# Patient Record
Sex: Female | Born: 1960 | Race: Black or African American | Hispanic: No | Marital: Married | Smoking: Former smoker
Health system: Southern US, Community
[De-identification: ages and names within clinical notes are randomized; demographics above are authoritative.]

## PROBLEM LIST (undated history)

## (undated) DIAGNOSIS — I10 Essential (primary) hypertension: Secondary | ICD-10-CM

## (undated) DIAGNOSIS — R809 Proteinuria, unspecified: Secondary | ICD-10-CM

## (undated) DIAGNOSIS — E113293 Type 2 diabetes mellitus with mild nonproliferative diabetic retinopathy without macular edema, bilateral: Secondary | ICD-10-CM

## (undated) DIAGNOSIS — Z636 Dependent relative needing care at home: Secondary | ICD-10-CM

## (undated) DIAGNOSIS — I4891 Unspecified atrial fibrillation: Secondary | ICD-10-CM

## (undated) DIAGNOSIS — I2510 Atherosclerotic heart disease of native coronary artery without angina pectoris: Secondary | ICD-10-CM

## (undated) DIAGNOSIS — I219 Acute myocardial infarction, unspecified: Secondary | ICD-10-CM

## (undated) DIAGNOSIS — R112 Nausea with vomiting, unspecified: Secondary | ICD-10-CM

## (undated) DIAGNOSIS — E559 Vitamin D deficiency, unspecified: Secondary | ICD-10-CM

## (undated) DIAGNOSIS — K219 Gastro-esophageal reflux disease without esophagitis: Secondary | ICD-10-CM

## (undated) DIAGNOSIS — E7800 Pure hypercholesterolemia, unspecified: Secondary | ICD-10-CM

## (undated) DIAGNOSIS — Z7984 Long term (current) use of oral hypoglycemic drugs: Secondary | ICD-10-CM

## (undated) DIAGNOSIS — I209 Angina pectoris, unspecified: Secondary | ICD-10-CM

## (undated) DIAGNOSIS — E119 Type 2 diabetes mellitus without complications: Secondary | ICD-10-CM

## (undated) DIAGNOSIS — M549 Dorsalgia, unspecified: Secondary | ICD-10-CM

## (undated) DIAGNOSIS — G709 Myoneural disorder, unspecified: Secondary | ICD-10-CM

## (undated) DIAGNOSIS — M1990 Unspecified osteoarthritis, unspecified site: Secondary | ICD-10-CM

## (undated) DIAGNOSIS — I509 Heart failure, unspecified: Secondary | ICD-10-CM

## (undated) DIAGNOSIS — R413 Other amnesia: Secondary | ICD-10-CM

## (undated) DIAGNOSIS — G4700 Insomnia, unspecified: Secondary | ICD-10-CM

## (undated) DIAGNOSIS — L659 Nonscarring hair loss, unspecified: Secondary | ICD-10-CM

## (undated) DIAGNOSIS — Z86718 Personal history of other venous thrombosis and embolism: Secondary | ICD-10-CM

## (undated) DIAGNOSIS — Z87891 Personal history of nicotine dependence: Secondary | ICD-10-CM

## (undated) DIAGNOSIS — E1122 Type 2 diabetes mellitus with diabetic chronic kidney disease: Secondary | ICD-10-CM

## (undated) DIAGNOSIS — D689 Coagulation defect, unspecified: Secondary | ICD-10-CM

## (undated) DIAGNOSIS — F32A Depression, unspecified: Secondary | ICD-10-CM

## (undated) DIAGNOSIS — G4733 Obstructive sleep apnea (adult) (pediatric): Secondary | ICD-10-CM

## (undated) DIAGNOSIS — N189 Chronic kidney disease, unspecified: Secondary | ICD-10-CM

## (undated) DIAGNOSIS — R1033 Periumbilical pain: Secondary | ICD-10-CM

## (undated) DIAGNOSIS — R600 Localized edema: Secondary | ICD-10-CM

## (undated) DIAGNOSIS — R001 Bradycardia, unspecified: Secondary | ICD-10-CM

## (undated) DIAGNOSIS — F329 Major depressive disorder, single episode, unspecified: Secondary | ICD-10-CM

## (undated) DIAGNOSIS — I700 Atherosclerosis of aorta: Secondary | ICD-10-CM

## (undated) DIAGNOSIS — F419 Anxiety disorder, unspecified: Secondary | ICD-10-CM

## (undated) DIAGNOSIS — R0602 Shortness of breath: Secondary | ICD-10-CM

## (undated) HISTORY — DX: Periumbilical pain: R10.33

## (undated) HISTORY — DX: Bradycardia, unspecified: R00.1

## (undated) HISTORY — DX: Type 2 diabetes mellitus with diabetic chronic kidney disease: E11.22

## (undated) HISTORY — DX: Nausea with vomiting, unspecified: R11.2

## (undated) HISTORY — DX: Coagulation defect, unspecified: D68.9

## (undated) HISTORY — DX: Atherosclerosis of aorta: I70.0

## (undated) HISTORY — DX: Unspecified atrial fibrillation: I48.91

## (undated) HISTORY — PX: CORONARY ARTERY BYPASS GRAFT: SHX141

## (undated) HISTORY — DX: Other amnesia: R41.3

## (undated) HISTORY — DX: Vitamin D deficiency, unspecified: E55.9

## (undated) HISTORY — PX: CYST EXCISION: SHX5701

## (undated) HISTORY — DX: Chronic kidney disease, unspecified: N18.9

## (undated) HISTORY — DX: Personal history of other venous thrombosis and embolism: Z86.718

## (undated) HISTORY — DX: Dorsalgia, unspecified: M54.9

## (undated) HISTORY — DX: Type 2 diabetes mellitus with other diabetic kidney complication: R80.9

## (undated) HISTORY — DX: Morbid (severe) obesity due to excess calories: E66.01

## (undated) HISTORY — DX: Long term (current) use of oral hypoglycemic drugs: Z79.84

## (undated) HISTORY — DX: Localized edema: R60.0

## (undated) HISTORY — DX: Nonscarring hair loss, unspecified: L65.9

## (undated) HISTORY — DX: Dependent relative needing care at home: Z63.6

## (undated) HISTORY — PX: CHOLECYSTECTOMY: SHX55

## (undated) HISTORY — PX: CORONARY STENT PLACEMENT: SHX1402

## (undated) HISTORY — DX: Type 2 diabetes mellitus with mild nonproliferative diabetic retinopathy without macular edema, bilateral: E11.3293

## (undated) HISTORY — DX: Insomnia, unspecified: G47.00

## (undated) HISTORY — DX: Obstructive sleep apnea (adult) (pediatric): G47.33

## (undated) HISTORY — DX: Personal history of nicotine dependence: Z87.891

## (undated) HISTORY — DX: Atherosclerotic heart disease of native coronary artery without angina pectoris: I25.10

---

## 1998-01-11 ENCOUNTER — Ambulatory Visit (HOSPITAL_COMMUNITY): Admission: RE | Admit: 1998-01-11 | Discharge: 1998-01-11 | Payer: Self-pay | Admitting: Cardiology

## 1998-04-03 ENCOUNTER — Emergency Department (HOSPITAL_COMMUNITY): Admission: EM | Admit: 1998-04-03 | Discharge: 1998-04-03 | Payer: Self-pay | Admitting: Emergency Medicine

## 1998-04-04 ENCOUNTER — Ambulatory Visit (HOSPITAL_BASED_OUTPATIENT_CLINIC_OR_DEPARTMENT_OTHER): Admission: RE | Admit: 1998-04-04 | Discharge: 1998-04-04 | Payer: Self-pay | Admitting: Orthopaedic Surgery

## 1998-10-04 ENCOUNTER — Ambulatory Visit (HOSPITAL_COMMUNITY): Admission: RE | Admit: 1998-10-04 | Discharge: 1998-10-04 | Payer: Self-pay | Admitting: Cardiology

## 1998-10-04 ENCOUNTER — Encounter: Payer: Self-pay | Admitting: Cardiology

## 1999-01-17 ENCOUNTER — Encounter: Payer: Self-pay | Admitting: Endocrinology

## 1999-01-17 ENCOUNTER — Emergency Department (HOSPITAL_COMMUNITY): Admission: EM | Admit: 1999-01-17 | Discharge: 1999-01-17 | Payer: Self-pay | Admitting: Endocrinology

## 1999-02-28 ENCOUNTER — Encounter: Payer: Self-pay | Admitting: Orthopaedic Surgery

## 1999-02-28 ENCOUNTER — Ambulatory Visit (HOSPITAL_COMMUNITY): Admission: RE | Admit: 1999-02-28 | Discharge: 1999-02-28 | Payer: Self-pay | Admitting: Cardiology

## 1999-04-21 ENCOUNTER — Emergency Department (HOSPITAL_COMMUNITY): Admission: EM | Admit: 1999-04-21 | Discharge: 1999-04-21 | Payer: Self-pay | Admitting: Emergency Medicine

## 1999-04-21 ENCOUNTER — Encounter: Payer: Self-pay | Admitting: Emergency Medicine

## 1999-07-11 ENCOUNTER — Ambulatory Visit (HOSPITAL_COMMUNITY): Admission: RE | Admit: 1999-07-11 | Discharge: 1999-07-11 | Payer: Self-pay | Admitting: Cardiology

## 1999-07-11 ENCOUNTER — Encounter: Payer: Self-pay | Admitting: Cardiology

## 2000-03-29 ENCOUNTER — Encounter (HOSPITAL_BASED_OUTPATIENT_CLINIC_OR_DEPARTMENT_OTHER): Payer: Self-pay | Admitting: General Surgery

## 2000-03-31 ENCOUNTER — Ambulatory Visit (HOSPITAL_COMMUNITY): Admission: RE | Admit: 2000-03-31 | Discharge: 2000-03-31 | Payer: Self-pay | Admitting: General Surgery

## 2000-03-31 ENCOUNTER — Encounter (INDEPENDENT_AMBULATORY_CARE_PROVIDER_SITE_OTHER): Payer: Self-pay | Admitting: *Deleted

## 2000-04-29 ENCOUNTER — Other Ambulatory Visit: Admission: RE | Admit: 2000-04-29 | Discharge: 2000-04-29 | Payer: Self-pay | Admitting: Obstetrics

## 2000-04-29 ENCOUNTER — Encounter (INDEPENDENT_AMBULATORY_CARE_PROVIDER_SITE_OTHER): Payer: Self-pay

## 2000-05-06 ENCOUNTER — Encounter: Payer: Self-pay | Admitting: Obstetrics

## 2000-05-06 ENCOUNTER — Ambulatory Visit (HOSPITAL_COMMUNITY): Admission: RE | Admit: 2000-05-06 | Discharge: 2000-05-06 | Payer: Self-pay | Admitting: Obstetrics

## 2001-01-05 ENCOUNTER — Emergency Department (HOSPITAL_COMMUNITY): Admission: EM | Admit: 2001-01-05 | Discharge: 2001-01-05 | Payer: Self-pay

## 2001-08-15 ENCOUNTER — Encounter: Payer: Self-pay | Admitting: Cardiology

## 2001-08-15 ENCOUNTER — Ambulatory Visit (HOSPITAL_COMMUNITY): Admission: RE | Admit: 2001-08-15 | Discharge: 2001-08-15 | Payer: Self-pay | Admitting: Cardiology

## 2001-08-25 ENCOUNTER — Ambulatory Visit (HOSPITAL_COMMUNITY): Admission: RE | Admit: 2001-08-25 | Discharge: 2001-08-25 | Payer: Self-pay | Admitting: Obstetrics

## 2001-08-25 ENCOUNTER — Encounter: Payer: Self-pay | Admitting: Obstetrics

## 2002-09-08 ENCOUNTER — Ambulatory Visit (HOSPITAL_COMMUNITY): Admission: RE | Admit: 2002-09-08 | Discharge: 2002-09-08 | Payer: Self-pay | Admitting: Obstetrics

## 2002-09-08 ENCOUNTER — Encounter: Payer: Self-pay | Admitting: Obstetrics

## 2002-10-21 ENCOUNTER — Inpatient Hospital Stay (HOSPITAL_COMMUNITY): Admission: EM | Admit: 2002-10-21 | Discharge: 2002-10-25 | Payer: Self-pay | Admitting: Emergency Medicine

## 2002-10-21 ENCOUNTER — Encounter: Payer: Self-pay | Admitting: Emergency Medicine

## 2002-10-23 ENCOUNTER — Encounter: Payer: Self-pay | Admitting: Cardiology

## 2002-12-12 ENCOUNTER — Ambulatory Visit (HOSPITAL_COMMUNITY): Admission: RE | Admit: 2002-12-12 | Discharge: 2002-12-14 | Payer: Self-pay | Admitting: Cardiology

## 2003-01-01 ENCOUNTER — Encounter (HOSPITAL_COMMUNITY): Admission: RE | Admit: 2003-01-01 | Discharge: 2003-04-01 | Payer: Self-pay | Admitting: Cardiology

## 2003-03-16 ENCOUNTER — Encounter: Payer: Self-pay | Admitting: Cardiology

## 2003-03-16 ENCOUNTER — Ambulatory Visit (HOSPITAL_COMMUNITY): Admission: RE | Admit: 2003-03-16 | Discharge: 2003-03-16 | Payer: Self-pay | Admitting: Cardiology

## 2003-08-24 ENCOUNTER — Ambulatory Visit (HOSPITAL_COMMUNITY): Admission: RE | Admit: 2003-08-24 | Discharge: 2003-08-24 | Payer: Self-pay | Admitting: Obstetrics

## 2003-10-10 ENCOUNTER — Encounter (INDEPENDENT_AMBULATORY_CARE_PROVIDER_SITE_OTHER): Payer: Self-pay | Admitting: Specialist

## 2003-10-10 ENCOUNTER — Ambulatory Visit (HOSPITAL_COMMUNITY): Admission: RE | Admit: 2003-10-10 | Discharge: 2003-10-10 | Payer: Self-pay | Admitting: Obstetrics

## 2004-02-18 ENCOUNTER — Ambulatory Visit (HOSPITAL_COMMUNITY): Admission: RE | Admit: 2004-02-18 | Discharge: 2004-02-18 | Payer: Self-pay | Admitting: Cardiology

## 2004-05-08 ENCOUNTER — Ambulatory Visit (HOSPITAL_COMMUNITY): Admission: RE | Admit: 2004-05-08 | Discharge: 2004-05-08 | Payer: Self-pay | Admitting: Cardiology

## 2004-07-08 ENCOUNTER — Encounter: Admission: RE | Admit: 2004-07-08 | Discharge: 2004-08-07 | Payer: Self-pay | Admitting: Orthopedic Surgery

## 2004-09-18 ENCOUNTER — Ambulatory Visit (HOSPITAL_COMMUNITY): Admission: RE | Admit: 2004-09-18 | Discharge: 2004-09-18 | Payer: Self-pay | Admitting: Obstetrics

## 2004-10-17 ENCOUNTER — Inpatient Hospital Stay (HOSPITAL_COMMUNITY): Admission: EM | Admit: 2004-10-17 | Discharge: 2004-10-17 | Payer: Self-pay | Admitting: Emergency Medicine

## 2005-05-10 ENCOUNTER — Emergency Department (HOSPITAL_COMMUNITY): Admission: EM | Admit: 2005-05-10 | Discharge: 2005-05-10 | Payer: Self-pay | Admitting: Family Medicine

## 2005-07-13 ENCOUNTER — Emergency Department (HOSPITAL_COMMUNITY): Admission: EM | Admit: 2005-07-13 | Discharge: 2005-07-13 | Payer: Self-pay | Admitting: Family Medicine

## 2005-07-17 ENCOUNTER — Inpatient Hospital Stay (HOSPITAL_COMMUNITY): Admission: AD | Admit: 2005-07-17 | Discharge: 2005-07-17 | Payer: Self-pay | Admitting: Obstetrics

## 2005-09-25 ENCOUNTER — Ambulatory Visit (HOSPITAL_COMMUNITY): Admission: RE | Admit: 2005-09-25 | Discharge: 2005-09-25 | Payer: Self-pay | Admitting: Obstetrics

## 2005-10-05 ENCOUNTER — Ambulatory Visit (HOSPITAL_COMMUNITY): Admission: RE | Admit: 2005-10-05 | Discharge: 2005-10-05 | Payer: Self-pay | Admitting: Cardiology

## 2006-11-12 ENCOUNTER — Ambulatory Visit (HOSPITAL_COMMUNITY): Admission: RE | Admit: 2006-11-12 | Discharge: 2006-11-12 | Payer: Self-pay | Admitting: Obstetrics

## 2006-12-14 ENCOUNTER — Emergency Department (HOSPITAL_COMMUNITY): Admission: EM | Admit: 2006-12-14 | Discharge: 2006-12-14 | Payer: Self-pay | Admitting: Family Medicine

## 2007-06-01 ENCOUNTER — Emergency Department (HOSPITAL_COMMUNITY): Admission: EM | Admit: 2007-06-01 | Discharge: 2007-06-01 | Payer: Self-pay | Admitting: Emergency Medicine

## 2007-09-21 ENCOUNTER — Encounter (HOSPITAL_COMMUNITY): Admission: RE | Admit: 2007-09-21 | Discharge: 2007-09-22 | Payer: Self-pay | Admitting: Cardiology

## 2007-11-21 ENCOUNTER — Ambulatory Visit (HOSPITAL_COMMUNITY): Admission: RE | Admit: 2007-11-21 | Discharge: 2007-11-21 | Payer: Self-pay | Admitting: Obstetrics

## 2008-04-08 ENCOUNTER — Emergency Department (HOSPITAL_COMMUNITY): Admission: EM | Admit: 2008-04-08 | Discharge: 2008-04-08 | Payer: Self-pay | Admitting: Emergency Medicine

## 2008-09-05 ENCOUNTER — Emergency Department (HOSPITAL_COMMUNITY): Admission: EM | Admit: 2008-09-05 | Discharge: 2008-09-05 | Payer: Self-pay | Admitting: Family Medicine

## 2009-02-19 ENCOUNTER — Ambulatory Visit (HOSPITAL_COMMUNITY): Admission: RE | Admit: 2009-02-19 | Discharge: 2009-02-19 | Payer: Self-pay | Admitting: Cardiology

## 2009-02-19 ENCOUNTER — Ambulatory Visit: Payer: Self-pay | Admitting: Vascular Surgery

## 2009-02-19 ENCOUNTER — Encounter (INDEPENDENT_AMBULATORY_CARE_PROVIDER_SITE_OTHER): Payer: Self-pay | Admitting: Cardiology

## 2009-03-28 ENCOUNTER — Emergency Department (HOSPITAL_COMMUNITY): Admission: EM | Admit: 2009-03-28 | Discharge: 2009-03-28 | Payer: Self-pay | Admitting: Emergency Medicine

## 2009-04-23 ENCOUNTER — Encounter: Admission: RE | Admit: 2009-04-23 | Discharge: 2009-04-23 | Payer: Self-pay | Admitting: Obstetrics

## 2009-11-27 ENCOUNTER — Encounter (HOSPITAL_COMMUNITY): Admission: RE | Admit: 2009-11-27 | Discharge: 2010-01-21 | Payer: Self-pay | Admitting: Cardiology

## 2010-04-26 ENCOUNTER — Emergency Department (HOSPITAL_COMMUNITY): Admission: EM | Admit: 2010-04-26 | Discharge: 2010-04-26 | Payer: Self-pay | Admitting: Emergency Medicine

## 2010-06-11 ENCOUNTER — Ambulatory Visit (HOSPITAL_COMMUNITY): Admission: RE | Admit: 2010-06-11 | Payer: Self-pay | Source: Home / Self Care | Admitting: Obstetrics

## 2010-07-13 ENCOUNTER — Encounter: Payer: Self-pay | Admitting: Obstetrics

## 2010-07-13 ENCOUNTER — Encounter: Payer: Self-pay | Admitting: Cardiology

## 2010-07-14 ENCOUNTER — Encounter: Payer: Self-pay | Admitting: Obstetrics

## 2010-09-03 LAB — COMPREHENSIVE METABOLIC PANEL
AST: 32 U/L (ref 0–37)
Albumin: 3.7 g/dL (ref 3.5–5.2)
Alkaline Phosphatase: 80 U/L (ref 39–117)
BUN: 7 mg/dL (ref 6–23)
CO2: 27 mEq/L (ref 19–32)
Calcium: 9 mg/dL (ref 8.4–10.5)
Chloride: 103 mEq/L (ref 96–112)
Creatinine, Ser: 0.6 mg/dL (ref 0.4–1.2)
GFR calc Af Amer: >60 mL/min (ref >60)
Glucose, Bld: 145 mg/dL — ABNORMAL HIGH (ref 70–99)
Sodium: 137 mEq/L (ref 135–145)
Total Protein: 7 g/dL (ref 6.0–8.3)

## 2010-09-03 LAB — DIFFERENTIAL
Basophils Absolute: 0 10*3/uL (ref 0.0–0.1)
Basophils Relative: 0 % (ref 0–1)
Lymphocytes Relative: 32 % (ref 12–46)
Monocytes Absolute: 0.4 10*3/uL (ref 0.1–1.0)
Neutro Abs: 3.5 10*3/uL (ref 1.7–7.7)
Neutrophils Relative %: 59 % (ref 43–77)

## 2010-09-03 LAB — CBC
HCT: 38 % (ref 36.0–46.0)
Hemoglobin: 12.3 g/dL (ref 12.0–15.0)
MCHC: 32.4 g/dL (ref 30.0–36.0)
MCV: 84.6 fL (ref 78.0–100.0)
RBC: 4.49 MIL/uL (ref 3.87–5.11)

## 2010-11-07 NOTE — Discharge Summary (Signed)
NAME:  Diane Steele, Diane Steele                          ACCOUNT NO.:  [id number]   MEDICAL RECORD NO.:  [id number]                   PATIENT TYPE:  OIB   LOCATION:  6527                                 FACILITY:  [facility]   PHYSICIAN:  Mohan N. Terrence Dupont, M.D.              DATE OF BIRTH:  [birth year]   DATE OF ADMISSION:  12/12/2002  DATE OF DISCHARGE:  12/14/2002                                 DISCHARGE SUMMARY   ADMISSION DIAGNOSES:  1. Stable angina, status post recent small non-Q-wave myocardial infarction,     status post recent percutaneous transluminal coronary angioplasty and     stenting to saphenous vein graft to diagonal 1.  2. Severe three-vessel coronary artery disease, status post coronary artery     bypass graft.  3. Hypertension.  4. Noninsulin-dependent diabetes mellitus.  5. Hypercholesterolemia.  6. Morbid obesity.  7. History of tobacco abuse.  8. History of gastritis.  9. Chronic anemia.  10.      History of menorrhagia in the past.   FINAL DIAGNOSES:  1. Stable angina, status post percutaneous transluminal coronary angioplasty     and stenting to left circumflex, status post recent small non-Q-wave     myocardial infarction, status post percutaneous transluminal coronary     angioplasty and stent to saphenous vein graft to diagonal 1 in May of     2004.  2. Three-vessel coronary artery disease, status post coronary artery bypass     graft.  3. Noninsulin-dependent diabetes mellitus.  4. Hypercholesterolemia.  5. Morbid obesity.  6. History of tobacco abuse.  7. Chronic anemia.  8. History of gastritis in the past.  9. History of menorrhagia in the past.   DISCHARGE HOME MEDICATIONS:  1. Toprol XL 100 mg one tablet.  2. Altace 10 mg one capsule daily.  3. Imdur 60 mg one tablet daily in the morning.  4. Baby aspirin 81 mg two tablets daily.  5. Plavix 75 mg one tablet daily with food.  6. Lipitor 20 mg one tablet daily.  7. Prevacid 30 mg one capsule daily.  8. Avandamet 1/500 one tablet twice daily.  9. Glucotrol 10 mg one tablet twice daily.  10.      Nitroglycerin sublingual 0.4 mg as needed.  11.      Feosol one tablet twice daily.   ACTIVITY:  Avoid heavy lifting, pushing or pulling for forty-eight hours.   DIET:  Low salt/low cholesterol/1800 calories/weight-reducing diet.   DISCHARGE INSTRUCTIONS:  Post angioplasty and stent instructions have been  given.  The patient has been advised to monitor blood sugar twice daily as  before.  Patient has been scheduled for outpatient nutrition and diabetic  management classes.  The patient will be scheduled for cardiac  rehabilitation as an outpatient.  She will follow up with me in one week.   CONDITION AT DISCHARGE:  Is stable.   BRIEF HISTORY OF  PRESENT ILLNESS:  [name] is a 50 year old black  female with past medical history significant for severe three-vessel  coronary artery disease, status post coronary artery bypass graft in  February of 1997, hypertension, noninsulin-dependent diabetes mellitus,  status post recent non-Q-wave myocardial infarction requiring percutaneous  transluminal coronary angioplasty and stenting to saphenous vein graft to  diagonal 1 in May of 2004 and was noted to have severe distal left  circumflex stenosis and moderate proximal left circumflex stenosis which was  not bypassed.  The patient also has hypercholesterolemia, history of tobacco  abuse, history of gastritis, chronic anemia and was admitted for elective  percutaneous intervention to monitor the left circumflex.  The patient was  recently discharged from the hospital following a small non-Q-wave  myocardial infarction requiring percutaneous transluminal coronary  angioplasty and stenting to the saphenous vein graft to diagonal 1.  The  patient complained of retrosternal chest heaviness off and on lasting a few  minutes and relieved with rest and sublingual nitroglycerin.  The patient   denies any nausea, vomiting or diaphoresis, denies palpitations,  lightheadedness or syncope and denies paroxysmal nocturnal dyspnea,  orthopnea or leg swelling.   PAST MEDICAL HISTORY:  As above.   PAST SURGICAL HISTORY:  She had coronary artery bypass grafting in February  of 1997.  She had cholecystectomy in the past.  She had right breast cyst  resection in 1989.  She had tubal ligation in 1987.  She had benign  dissection of a vaginal tumor in 1998.   SOCIAL HISTORY:  She is married.  She has three children.  She smokes one  pack per day times nine years, quit after coronary artery bypass grafting.  No history of alcohol abuse, though she drinks beer occasionally socially.  No history of drug abuse.   FAMILY HISTORY:  Her father is alive.  He is hypertensive and diabetic.  He  has congestive heart failure.  Her mother is diabetic.  She has angina and  hypertension.   MEDICATIONS AT HOME:  1. Toprol XL 100 mg p.o. daily.  2. Altace 5 mg p.o. daily.  3. Imdur 60 mg p.o. daily.  4. Baby aspirin 81 mg p.o. daily.  5. Plavix 75 mg p.o. daily.  6. Lipitor 40 mg p.o. daily.  7. Prevacid 30 mg p.o. daily.  8. Nitroglycerin sublingual p.r.n.  9. Glucotrol 10 mg twice daily.  10.      Avandamet 1/500 mg p.o. twice daily.   PHYSICAL EXAMINATION:  General:  On examination, she alert, awoke and  oriented x3 in no acute distress.  Vital Signs:  Her blood pressure was  110/60, pulse was 76 and regular.  Eyes:  Conjunctivae were pink.  Neck:  Supple.  No jugular venous distention.  No bruit.  Lungs:  Were clear to  auscultation without rhonchi or rales.  Cardiovascular Examination:  S1 and  S2 were normal.  There was no S3 gallop.  Abdomen:  Was soft.  Bowel sounds  were present.  Abdomen was nontender.  Extremities:  There was no clubbing,  cyanosis or edema.   LABORATORY DATA:  Her hemoglobin was 9.5, hematocrit was 28.8 and white count was 6.0.  CK post procedure was 69 with an MB  of 2.4.  Her hemoglobin  today is stable at 9.7, hematocrit of 29.5 and white count of 5.3.  Her  cholesterol is 93, low-density lipoprotein 49, high-density lipoprotein 35.  Potassium is 3.9, blood urea nitrogen is 7 and  creatinine .8.  Admission EKG  showed normal sinus rhythm with mild T wave inversion in lateral leads.  Repeat EKG post procedure showed similar changes.  Repeat EKG done today  also showed T wave inversion in lateral leads.   BRIEF HOSPITAL COURSE:  The patient was an a.m. admit and underwent  percutaneous transluminal coronary angioplasty and stenting to left  circumflex as per procedure report.  The patient tolerated the procedure  well.  There were no complications.  The patient had one episode of chest  pain lasting three minutes relieved with one sublingual nitroglycerin.  The  patient has been ambulating in the hallway without any problems.  Her CPK's  are negative post procedure.  Post procedure EKG showed similar T wave  inversions in lateral leads.  There were no new acute ischemic changes.  This morning, her blood pressure has been elevated.  The patient has been  restarted on her Imdur and angiotensin-converting enzyme inhibitor.  The  patient will be continued on beta blockers and the rest of her medications.  The patient has been advised to ambulate in the hallway today.  If she stays  asymptomatic, the patient will be discharged home this afternoon.                                               Allegra Lai. Terrence Dupont, M.D.    MNH/MEDQ  D:  12/14/2002  T:  12/15/2002  Job:  [id number]

## 2010-11-07 NOTE — Cardiovascular Report (Signed)
Diane, Steele                ACCOUNT NO.:  [id number]   MEDICAL RECORD NO.:  [id number]          PATIENT TYPE:  OIB   LOCATION:  2899                         FACILITY:  [facility]   PHYSICIAN:  Allegra Lai. Terrence Dupont, M.D. DATE OF BIRTH:  [birth year]   DATE OF PROCEDURE:  05/08/2004  DATE OF DISCHARGE:  05/08/2004                              CARDIAC CATHETERIZATION   PROCEDURE:  Left cardiac catheterization with selective left and right  coronary angiography, visualization of saphenous vein grafts, left internal  mammary graft, left ventriculography, via right groin using Judkins  technique.   INDICATIONS FOR PROCEDURE:  Diane Steele is a 50 year old black female with  past medical history significant for severe three vessel coronary artery  disease status post coronary artery bypass grafting x 4 in February 1997.  She had LIMA to LAD, saphenous vein graft to RCA, saphenous vein graft to  first diagonal, and saphenous vein graft to second obtuse marginal.  She had  non-Q wave MI in May 2004 and subsequently had PTCA and stenting to  saphenous vein graft to first diagonal and also had PTCA and stenting to  left circumflex in June 2004 which was not bypassed.  The patient also has a  history of hypertension, non-insulin dependent diabetes mellitus,  hypercholesterolemia, morbid obesity, history of tobacco abuse, history of  chronic anemia, history of gastritis, complaints of retrosternal chest pain  described as heaviness, grade 5/10, relieved with rest and sublingual  nitroglycerin.  Also complains of exertional dyspnea and chest pain which is  relieved with rest and sublingual nitroglycerin.  The patient denies any  palpitations, light headedness, or syncope.  Denies PND, orthopnea, or leg  swelling.  Denies nausea, vomiting, diaphoresis.  Denies rest or nocturnal  angina.  Denies relation of chest pain to food, breathing, or movement.   PAST MEDICAL HISTORY:  As above.   PAST SURGICAL  HISTORY:  She had coronary artery bypass grafting x 4 as above  in 1997.  She had cholecystectomy in the past.  She had right wrist cyst  resection in the past.  She had tubal ligation in 1987.  She had resection  of benign vaginal tumor in 1988.   SOCIAL HISTORY:  She is married and has three children.  She smoked one pack  per day for nine years, quit after CABG, no history of alcohol abuse, drinks  beer occasionally socially, no history of drug abuse.   FAMILY HISTORY:  Positive for diabetes and hypertension.   ALLERGIES:  No known drug allergies.   MEDICATIONS:  Baby aspirin 81 mg p.o. daily, Plavix 75 mg p.o. daily, Toprol  XL 100 mg p.o. daily, Altace 10 mg p.o. daily, Imdur 60 mg p.o. q.a.m.,  Prevacid 30 mg p.o. daily, Glucophage XR 500 mg p.o. b.i.d., Glucotrol XL 10  mg p.o. daily, Lipitor 20 mg p.o. daily.   PHYSICAL EXAMINATION:  She was alert, awake, oriented X 3 in no acute  distress.  Blood pressure in the office was 120/70, pulse 72 and regular.  Conjunctivae was pink.  Neck supple, no JVD, no bruit.  Lungs  were clear to  auscultation without rhonchi or rales.  Cardiovascular exam revealed S1 and  S2 normal, there was no S3 gallop.  Abdomen was soft, bowel sounds present,  nontender.  Extremities:  No cyanosis, clubbing, and edema.   IMPRESSION:  Accelerated angina, rule out progression of coronary artery  disease, CAD, history of non-Q wave MI status post CABG, status post PCI to  saphenous vein graft to first diagonal in May 2004 and PCI to left  circumflex in June 2004.  Hypertension, non insulin dependent diabetes  mellitus, hypercholesterolemia, history of tobacco abuse, morbid obesity,  chronic anemia, history of gastritis.   I discussed with the patient regarding various options of treatment, i.e.  noninvasive stress testing versus left cath with possible PTCA and stenting  with risks and benefits, i.e., death, MI, stroke, need for emergency CABG,  risks of  restenosis, local vascular complications, etc., and consent was  obtained for PCI.   PROCEDURE:  After obtaining informed consent, the patient was taken to the  cath lab and placed on fluoroscopy table.  The right groin was prepped and  draped in the usual fashion.  2% Xylocaine was used for local anesthesia in  the right groin.  With the help of thin wall needle, 6 French arterial  sheath was placed.  The sheath was aspirated and flushed.  Next, 6 French  left Judkins catheter was advanced over the wire under fluoroscopic guidance  to the ascending aorta.  The wire was pulled out, the catheter was  aspirated, and connected to the manifold.  The catheter was further advanced  and engaged into the left coronary ostium.  Multiple views of the left  system were taken.  Next, the catheter was disengaged and was pulled out  over the wire and was replaced with 6 French right Judkins catheter which  was advanced over the wire under fluoroscopic guidance up to the ascending  aorta.  The wire was pulled out, the catheter was aspirated and connected to  the manifold.  The catheter was further advanced and engaged into right  coronary ostium.  A single view of the right coronary artery was obtained.  Next, catheter was disengaged and was engaged into saphenous vein graft to  first diagonal.  Multiple views of this graft were taken.  Next, the  catheter was disengaged and was engaged into saphenous vein graft to OM2,  multiple views of this graft were taken.  Next, this catheter was disengaged  and was engaged into saphenous vein graft to RCA. Multiple views of this  graft were taken.  Next, the catheter was disengaged and was advanced under  fluoroscopic guidance up to the left subclavian artery.  This catheter was  exchanged over the wire with LIMA diagnostic catheter which was advanced  under fluoroscopic guidance and was engaged to LIMA to LAD.  Multiple views of this graft were taken.  Next, the  catheter was disengaged and was pulled  out over the wire and was replaced with a 6 French pigtail catheter which  was advanced over the wire under fluoroscopic guidance up to the ascending  aorta.  The wire was pulled out and the catheter was aspirated and connected  to the manifold.  The catheter was further advanced across the aortic valve  into the LV.  LV pressures were recorded.  Next, LV-graph was done in 30  degree RAO position, post angiographic pressures recorded from LV and then  pull back pressures were recorded from the  aorta.  There was no gradient  across the aortic valve.  Next, the pigtail catheter was pulled out over the  wire, the sheaths were aspirated and flushed.   FINDINGS:  LV showed inferior wall mid severe hypokinesia, EF of 50-55%.  The left main was patent.  The LAD was 100% occluded proximally as before.  The left circumflex has 15-20% ostial stenosis.  Stented segment in  proximal, mid, and distal portion was patent except there was 15-20% mid  focal stenosis with TIMI grade 3 distal flow.  OM1 was 100% occluded which  was a small vessel.  OM2 has 70-80% long proximal stenosis.  This vessel has  saphenous vein graft which is filling from saphenous vein graft to OM2.  RCA  is 100% occluded at the proximal portion as before.  Saphenous vein graft to  first diagonal has 30-40% proximal stenosis at the proximal edge of the  stent and mid portion of the stent with TIMI grade 3 distal flow.  Saphenous  vein graft to OM2 has 20-30% proximal stenosis and 50-60% anastomotic  stenosis.  This also has TIMI grade 3 distal flow.  Saphenous vein graft to  RCA has 30-40% proximal and mid stenosis, and 70-80% anastomotic stenosis.  This graft is degenerated with TIMI grade 2 distal flow and is not suitable  for an intervention, distally, the native vessel is very small.  The LIMA to  LAD is patent.   The patient tolerated the procedure well.  There were no complications.    PLAN:  Maximize anti-anginal medication.  The patient will be discharged  home this afternoon.  The patient was transferred to the recovery room in  stable condition.       MNH/MEDQ  D:  05/08/2004  T:  05/08/2004  Job:  [id number]

## 2010-11-07 NOTE — Op Note (Signed)
NAME:  Diane Steele, Diane Steele                          ACCOUNT NO.:  [id number]   MEDICAL RECORD NO.:  [id number]                   PATIENT TYPE:  AMB   LOCATION:  SDC                                  FACILITY:  [facility]   PHYSICIAN:  Frederico Hamman, M.D.           DATE OF BIRTH:  1960-08-19   DATE OF PROCEDURE:  10/10/2003  DATE OF DISCHARGE:                                 OPERATIVE REPORT   PREOPERATIVE DIAGNOSES:  1. Dysfunctional uterine bleeding.  2. Dysmenorrhea.   ANESTHESIA:  General.   SURGEON:  Frederico Hamman, M.D.   Under general anesthesia, patient  was in lithotomy position.  The perineum  and vagina were prepped and draped, the bladder emptied with a straight  catheter.  Bimanual exam revealed the uterus to be normal size, negative  adnexa.  A speculum was placed in the vagina and the cervix was grasped with  the tenaculum at 12 o'clock, and the cervix was curetted.  The endometrial  cavity was sounded to 9 cm.  The Hegar dilator was placed in the cervix and  the cervix length was 4 cm, making the cavity length 5 cm.  Endocervix was  curetted.  Cervix was dilated to a #27 Kennon Rounds.  The diagnostic hysteroscope  was then inserted, the pump was set at 70, and we used Ringer's lactate.  The cavity was noted to be normal.  The hysteroscope was removed.  The  Novasure device was inserted and the cavity integrity was tested and found  to be normal.  The Novasure ablation was performed at 88 watts for two  minutes.  The length of the cavity was 5 cm and the width was 4 cm.  After  the ablation was complete, the Novasure device was removed and the  hysteroscope reinserted.  The cavity was noted to be completely ablated.  The procedure was terminated and the patient tolerated the procedure well,  taken to the recovery room in good condition.                                               Frederico Hamman, M.D.    BAM/MEDQ  D:  10/10/2003  T:  10/11/2003  Job:  [id number]

## 2010-11-07 NOTE — Discharge Summary (Signed)
[name], [name]                ACCOUNT NO.:  [id number]   MEDICAL RECORD NO.:  [id number]          PATIENT TYPE:  INP   LOCATION:  3707                         FACILITY:  [facility]   PHYSICIAN:  Mohan N. Terrence Dupont, M.D. DATE OF BIRTH:  [birth year]   DATE OF ADMISSION:  10/16/2004  DATE OF DISCHARGE:  10/17/2004                                 DISCHARGE SUMMARY   ADMITTING DIAGNOSES:  1.  New onset angina, rule out coronary insufficiency.  2.  Coronary artery disease.  History of non-Q wave myocardial infarction,      status post coronary artery bypass graft.  3.  Hypertension.  4.  Non-insulin-dependent diabetes mellitus.  5.  Hypercholesterolemia.  6.  History of gastritis.  7.  Chronic anemia.   DISCHARGE DIAGNOSES:  1.  Stable angina.  Status post negative Persantine Myoview.  2.  Coronary artery disease.  History of non-Q wave myocardial infarction.      Status post percutaneous coronary intervention in the past, post      coronary artery bypass graft.  3.  Status post coronary artery bypass grafting in 1997.  4.  Hypertension.  5.  Non-insulin-dependent diabetes mellitus.  6.  Hypercholesterolemia.  7.  History of gastritis.  8.  Chronic anemia.   DISCHARGE MEDICATIONS:  1.  Enteric-coated aspirin 81 mg one tablet daily.  2.  Plavix 75 mg one tablet daily with food.  3.  Toprol-XL 100 mg one tablet daily.  4.  Altace 10 mg one capsule daily.  5.  Imdur 60 mg one tablet twice daily.  6.  Prevacid 30 mg one capsule daily, one-half hour before breakfast.  7.  Lipitor 20 mg one tablet daily.  8.  Glucophage XR 500 mg one tablet twice daily.  9.  Glucotrol XL 10 mg one tablet daily.  10. Nitrostat 0.4 mg sublingual, use as directed.  11. Feosol one tablet twice daily.   DIET:  Low salt, low cholesterol, 1800 calorie ADA diet.  The patient has  been advised to reduce weight.   ACTIVITY:  As tolerated.   CONDITION OF PATIENT AT DISCHARGE:  Stable.   FOLLOWUP:  With Dr.  Terrence Dupont in 1 week.   BRIEF HISTORY AND HOSPITAL COURSE:  Diane Steele is a 50 year old black female  with a past medical history significant for severe 3 vessel coronary artery  disease, status post CABG x4 in February 1997, status post PCI to the  saphenous vein graft to diagonal 1 and left circumflex.  History of non-Q  wave myocardial infarction, hypertension, non-insulin-dependent diabetes  mellitus, hypercholesterolemia, history of tobacco abuse, history of  gastritis, chronic anemia.  She came to the ER complaining of retrosternal  chest pain described as heaviness at rest radiating to the left arm  associated with mild shortness of breath and pain 10/10.  The patient took 3  sublingual nitroglycerin with partial relief.  So, she called EMS and came  to the ER.  She denies any palpitations, light-headedness or syncope.  Denies any orthopnea or leg swelling.  EKG done in the ER showed normal  sinus  rhythm with T-wave inversion in anterolateral leads which were old.  Denies any relation of chest pain to food, breathing, movement.  Denies any  cough, fever or chills.  Denies abdominal pain.   PAST MEDICAL HISTORY:  As above.   PAST SURGICAL HISTORY:  1.  She had coronary artery bypass grafting x4 in February 1997, as above.  2.  Status post cholecystectomy.  3.  Status post tubal ligation.  4.  Status post right wrist surgery.  5.  Status post resection of benign vaginal tumor in the past.   SOCIAL HISTORY:  She is married and has 3 children, smoked 1 pack per day  for 9 years and quit after CABG.  No history of alcohol abuse.  She is  presently on disability.   FAMILY HISTORY:  Positive for diabetes and hypertension.   ALLERGIES:  No known drug allergies.   HOME MEDICATIONS:  1.  Baby aspirin 81 mg p.o. daily.  2.  Plavix 75 mg p.o. daily.  3.  Toprol-XL 30 mg p.o. daily.  4.  Altace 10 mg p.o. daily.  5.  Imdur 60 mg p.o. q.a.m.  6.  Prevacid 30 mg one capsule daily.  7.   Glucophage XR 500 mg p.o. t.i.d.  8.  Glucotrol XL 10 mg p.o. daily.  9.  Lipitor 20 mg p.o. daily.   PHYSICAL EXAMINATION:  GENERAL APPEARANCE:  Shows alert and oriented x3 and  in no acute distress.  VITAL SIGNS:  Blood pressure was 136/73, pulse of 66 and regular.  HEENT:  Conjunctivae pink.  NECK:  No JVD, no bruit.  LUNGS:  Clear to auscultation without rhonchi or rales.  CARDIOVASCULAR:  S1, S2 was normal.  There was no S3 gallop or murmur.  ABDOMEN:  Soft, bowel sounds are present, nontender.  EXTREMITIES:  No clubbing, cyanosis or edema.   LABORATORY DATA:  Her EKG showed normal sinus rhythm with T-wave inversion  in the anterolateral leads.  Cholesterol was 115, LDL 63, HDL was low at 36.  Three sets of cardiac enzymes, point-of-care, CK-MB and troponin I were  negative.  Two sets of cardiac enzymes:  CK was 52, MB 0.3.  Second set CK  51, MB  0.3.  Troponin I in 2 sets were 0.01.  Her liver enzymes were  normal.  Sodium was 137, potassium 3.8, chloride 107, bicarb 28.  Blood  sugar was 205.  BUN 7, creatinine 0.7.  Hemoglobin was 11.6, hematocrit  35.2, white count 6.2, repeat hemoglobin was stable.  Hemoglobin A1c is  still pending.   BRIEF HOSPITAL COURSE:  The patient was admitted to the telemetry unit and  MI was ruled out by serial enzymes and an EKG.  The patient underwent  Persantine Myoview today which showed no evidence of ischemia with ejection  fraction of 57%.  The patient did not have any further episodes of chest  pain during the hospital stay.  The patient will be discharged home on the  above medications.  Her Imdur has been increased to 50 mg twice daily.  Feosol has been added for chronic anemia.  The patient will be followed up  in my office in 1 week and GI in 2 weeks.      MNH/MEDQ  D:  10/17/2004  T:  10/18/2004  Job:  [id number]

## 2010-11-07 NOTE — Cardiovascular Report (Signed)
NAME:  Diane Steele, Diane Steele                          ACCOUNT NO.:  [id number]   MEDICAL RECORD NO.:  [id number]                   PATIENT TYPE:  OIB   LOCATION:  6527                                 FACILITY:  [facility]   PHYSICIAN:  Mohan N. Terrence Dupont, M.D.              DATE OF BIRTH:  [birth year]   DATE OF PROCEDURE:  12/12/2002  DATE OF DISCHARGE:  12/14/2002                              CARDIAC CATHETERIZATION   PROCEDURES:  1. Left cardiac catheterization.  2. Selective left coronary angiography with visualization of saphenous vein     graft to diagonal-1 via right groin using Judkins technique.  3. Successful percutaneous transluminal coronary angioplasty to distal left     circumflex using a 2.5 x 20 mm CrossSail balloon.  4. Successful deployment of 3.75 x 33 mm long CYPHER drug-eluting stent in     the distal left circumflex.  5. Successful deployment of 3.0 x 38 mm long __________ stent in the     proximal and mid left circumflex.   CARDIOLOGISTAllegra Lai Terrence Dupont, M.D.   INDICATIONS FOR THE PROCEDURE:  Diane Steele is a 50 year old black female with  a past medical history significant for severe three-vessel coronary artery  disease, status post CABG in February 1997, hypertension, noninsulin-  dependent diabetes mellitus, history of recent non-Q wave MI, status post  PTCA and stenting with saphenous vein graft to diagonal-1 in May 2004.  She  was noted to have severe distal left circumflex stenosis which was not  bypassed before.  She has a history of hypercholesterolemia, history of  tobacco abuse, history of gastritis, chronic anemia, history of menorrhagia.  She is admitted for elective PCI to the native vessel of the circumflex.   The patient was recently discharged from the hospital following a small non-  Q wave myocardial infarction and subsequently was noted to have restenosis  in the saphenous vein graft to the diagonal-1 requiring PTCA and stenting to  the diagonal-1.   The patient complains of retrosternal chest heaviness off  and on, lasting for a few minutes, relieved with rest and sublingual  nitroglycerin.  The patient denies any nausea, vomiting, diaphoresis, denies  palpitations, lightheadedness or syncope, denies PND, orthopnea or leg  swelling.   PAST MEDICAL HISTORY:  As above.  Due to recurrent anginal pain and severe  distal left circumflex stenosis which was not bypassed, this patient desires  PTCA and stenting.  It was stressed by me that MI, stroke, need for  emergency CABG, high risk for restenosis, vascular complications, and death,  and she consented for PCI.   DESCRIPTION OF PROCEDURE:  After obtaining informed consent, the patient was  brought to the catheterization laboratory and was placed on the fluoroscopy  table.  The right groin was prepped and draped in the usual fashion.  Xylocaine 2% was used for local anesthesia in the right groin.  With the  help  of a thin-walled needle, a 7-French arterial sheath was placed.  The  sheath was aspirated and flushed.   Next, a 6-French left saphenous vein graft catheter was advanced over the  wire under fluoroscopic guidance up to the ascending aorta. The wire was  pulled out.  The catheter was aspirated and connected to the manifold.  The  catheter was further advanced and engaged in the saphenous vein graft and to  the diagonal-1.  Multiple views of this graft were taken.  Next, the  catheter was disengaged and was pulled out over the wire, and was replaced  with a 7-French 3.5 Voda guiding catheter, which was advanced over the wire  under fluoroscopic guidance up to the ascending aorta.  The wire was pulled  out, the catheter was aspirated and connected to the manifold.  The catheter  was further advanced and engaged in the left coronary ostium.  Multiple  views of this graft were taken.   FINDINGS:  The saphenous vein graft to the diagonal-1 was patent at the  prior PTCA and stented  site, which was done on Oct 24, 2002, with excellent  TIMI-3 distal flow.  The left main was patent.  The LAD was 100% occluded as  before. The left circumflex had 60-70% proximal long tubular stenosis which  appeared to be 40-50% initially but, after stenting in the distal portion,  the stenosis appears to be 60-70% in the proximal left circumflex, which is  a long tubular stenosis, and also had 85-90% long distal stenosis.   INTERVENTIONAL PROCEDURE:  Successful PTCA to the distal left circumflex was  done using 2.5 x 20 mm long CrossSail balloon.  Multiple inflations were  done for three dilatations and then a 2.75 x 33 long CYPHER drug-eluting  stent was deployed at 10 atmospheric pressure, which was fully expanded  going up to 13 atmospheric pressure in the distal left circumflex.  Then a  3.0 x 38 mm long __________ stent was deployed in the proximal and mid left  circumflex at 10 atmospheric pressures.  It was fully expanded going up to  15 atmospheric pressure.  The lesion was dilated from  70-90% to 0% residual  with TIMI-3 distal flow.   The patient received weight-adjusted heparin, Integrilin and 300 mg of  Plavix during the procedure.  The patient tolerated the procedure well and  there were no complications.  The patient was transferred to the recovery  room in stable condition.                                               Allegra Lai. Terrence Dupont, M.D.    MNH/MEDQ  D:  12/14/2002  T:  12/15/2002  Job:  [id number]  Catheterization Laboratory   cc:   Catheterization Laboratory

## 2010-11-07 NOTE — Discharge Summary (Signed)
NAME:  Diane Steele, Diane Steele                          ACCOUNT NO.:  [id number]   MEDICAL RECORD NO.:  [id number]                   PATIENT TYPE:  INP   LOCATION:  6524                                 FACILITY:  [facility]   PHYSICIAN:  Mohan N. Terrence Dupont, M.D.              DATE OF BIRTH:  1961/05/26   DATE OF ADMISSION:  10/21/2002  DATE OF DISCHARGE:  10/25/2002                                 DISCHARGE SUMMARY   ADMISSION DIAGNOSES:  1. Unstable angina, rule out myocardial infarction.  2. Diabetes mellitus.  3. Hypertension.  4. Coronary artery disease.  5. Tobacco abuse.   FINAL DIAGNOSES:  1. Status post small non-Q-wave myocardial infarction.  2. Status post percutaneous transluminal coronary angioplasty/stenting to     saphenous vein graft to diagonal-1.  3. Coronary artery disease.  4. Severe three vessel disease, status post coronary artery bypass graft in     1997.  5. Hypertension.  6. Noninsulin-dependent diabetes mellitus.  7. Hypercholesterolemia.  8. Tobacco abuse.  9. Morbid obesity.  10.      History of gastritis.  11.      History of colinephritis.  12.      History of chronic anemia.   DISCHARGE MEDICATIONS:  1. Toprol XL 100 mg 1 tablet q.d.  2. Altace 5 mg 1 capsule q.d.  3. Imdur 60 mg 1 tablet q.d. in the morning.  4. Baby aspirin 81 mg 2 tablets q.d.  5. Plavix 70 mg 1 tablet q.d. with food.  6. Lipitor 20 mg 1 tablet q.d.  7. Prevacid 30 mg 1 capsule daily as before.  8. Nitrostat 0.4 mg sublingual use as directed.  9. Glucotrol XL 10 mg 1 tablet b.i.d.  10.      Avandomet 1/500 1 tablet b.i.d.  The patient has been advised to     start Avandomet after 48 hours.   ACTIVITY:  Avoid heavy exertion, lifting, pushing, or pulling.   DIET:  Low salt, low cholesterol, 1800, ADA diet.  The patient has been  advised to monitor blood sugar twice daily as before.   DISCHARGE INSTRUCTIONS:  Postangioplasty and stent instructions have been  given.  The patient has  been advised to quit smoking completely to which she  agrees to follow up with me in one week.   CONDITION ON DISCHARGE:  Stable.   HISTORY OF PRESENT ILLNESS:  The patient is a 50 year old black female with  past medical history significant for multiple medical problems, i.e.  coronary artery disease, status post coronary artery bypass grafting in  1997.  Subsequently had PTCA to saphenous vein graft to RCA in February of  1998.  Hypertension.  Noninsulin-dependent diabetes mellitus, morbid  obesity, hypercholesterolemia, history of tobacco abuse, gastritis.   She was admitted by Dr. Ardyth Gal. Spruill on Oct 21, 2002 for severe  retrosternal chest pain rated 9/10, described as heaviness and pressure  lasting for more than 15 minutes.  The patient took two sublingual  nitroglycerin without relief.  She also had one episode of vomiting and  palpitations.  The patient also complains of radiation of pain to neck and  left elbow.   PAST MEDICAL HISTORY:  As above.   PAST SURGICAL HISTORY:  1. She had coronary artery bypass grafting in 1997.  2. Cholecystectomy in the past.   SOCIAL HISTORY:  She lives at home with husband, three children.  Does state  tobacco abuse.  The patient does no drugs.   PHYSICAL EXAMINATION:  GENERAL:  She is hemodynamically stable.  LUNGS:  Clear.  CARDIOVASCULAR:  S1 and S2 is normal.  No S3 or S4 gallop.  EXTREMITIES:  There was no clubbing, cyanosis, or edema.   LABORATORY DATA:  The patient was admitted to telemetry unit.  The patient  ruled in for small non-Q-wave myocardial infarction.  The labs showed her  EKG showed normal sinus rhythm with T-wave inversion in the lateral leads.  Chest x-ray showed evidence of old CABG.  No acute abnormalities.   Her CK was 114 and MB 3.2.  Second set of CK was 94 and MB 3.9.  First CK 94  and MB of 3.4.  On fourth set after PCI, CK was 182, MB of 4.0.  This read  as all negative.  Her troponin I's were slightly  elevated.  It was 0.13,  0.29, and 0.20.  Her BNP was 56 which was within normal range.  Hemoglobin  A1C was 7.0.  Her liver enzymes were normal.  Blood sugar was 130.  Potassium 4.9.  BUN 11, creatinine 0.8.  Hemoglobin was 11.1, hematocrit  34.1, white count of 7.6.   HOSPITAL COURSE:  The patient was admitted to telemetry unit.  The patient  ruled in for mild non-Q-wave myocardial infarction due to elevated troponin  I an anginal chest pain, although her serial CPKs were within normal range.   The patient subsequently underwent Persantine-Cardiolite which showed a  small area of reversible ischemia in the anterolateral wall.  Subsequently,  the patient underwent PTCA and stenting to saphenous vein graft to diagonal-  1 as per procedure report.  The patient tolerated the procedure well.  There  were no complications postprocedure.  Her sheaths were pulled out without  any problems.  The patient had been ambulating in the hallway without any  anginal chest pain.  Her groin is stable.  There is no evidence of hematoma.  Postprocedure CKs are within normal range.  Her white blood count is stable.  Her EKG showed minimal improvement and lateral T-wave changes.   The patient will be discharged home on the following medications and will  follow up in my office in one week.  The patient will be rescheduled for PCI  to native vessel left circumflex in a few weeks.                                                 Allegra Lai. Terrence Dupont, M.D.    MNH/MEDQ  D:  10/25/2002  T:  10/26/2002  Job:  [id number]   cc:   Allegra Lai. Terrence Dupont, M.D.  [address]  [place] [address]  Fax: [phone]

## 2010-11-07 NOTE — Cardiovascular Report (Signed)
NAME:  Diane Steele, Diane Steele                          ACCOUNT NO.:  [id number]   MEDICAL RECORD NO.:  [id number]                   PATIENT TYPE:  INP   LOCATION:  6524                                 FACILITY:  [facility]   PHYSICIAN:  Mohan N. Terrence Dupont, M.D.              DATE OF BIRTH:  1961-05-17   DATE OF PROCEDURE:  10/24/2002  DATE OF DISCHARGE:  10/25/2002                              CARDIAC CATHETERIZATION   PROCEDURE:  1. Left cardiac catheterization with left and right coronary angiography,     visualization of saphenous vein graft, LIMA graft, left ventriculography     via the right groin using Judkins technique.  2. Successful PTCA to the proximal saphenous vein graft to diagonal 1 using     3.0 x 20 mm long Maverick balloon.  3. Successful deployment of 3.5 x 24 mm long Taxis drug eluding stent in the     proximal saphenous vein graft to the diagonal 1.   INDICATIONS FOR PROCEDURE:  The patient is a 50 year old black female with a  past medical history significant for multiple medical problems, i.e. severe  3-vessel coronary artery disease requiring coronary artery bypass grafting  in February 1997, status post PTCA to saphenous vein graft to right coronary  artery in February of 1998, hypertension, noninsulin dependent diabetes  mellitus, history of tobacco abuse, history of gastritis. She was admitted  by Dr. Montez Morita on Oct 21, 2002, because of moderately severe retrosternal  anginal chest pain.   She stated that this was one of her most severe bouts of angina since she  had stent placement in 1998. The chest pain was grade 9/10, that she  described as heaviness lasting for more than 15 minutes. She took 2  sublingual nitroglycerin with no relief so she came to the emergency room.  The patient ruled in for a small non Q wave myocardial infarction due to  minimally elevated troponin, although her CKs total were normal.  Subsequently the patient underwent a Persantine Cardiolite  which showed mild  anterolateral wall ischemia with an ejection fraction of 62%. Due to typical  anginal pain, recent small non Q wave MI and positive Cardiolite. The  patient was discussed regarding left catheterization, possible PTCA and  stenting and strong need for emergency coronary artery bypass graft because  of restenosis, local vascular complications. The patient consented for PCI.   DESCRIPTION OF PROCEDURE:  After informed consent was obtained the patient  was brought  to the catheterization laboratory and was placed on the  fluoroscope table. The right groin was prepped and draped in the usual  fashion. Then 2% Xylocaine was used as a local anesthesia on the right  groin. With the help of a thin walled needle, a 6 French arterial sheath was  placed. The sheath was aspirated and flushed.   Next a 6 French left Judkin catheter was advanced over the wire under  fluoroscopic guidance up to the ascending aorta. The wire was pulled out,  the catheter was aspirated and connected to the manifold. The catheter was  further advanced and engaged into the left coronary ostium. Multiple views  of the left system were taken.   Next the catheter was disengaged and was pulled out over the wire and was  replaced with a 6 French right Judkin catheter which was advanced over the  wire under fluoroscopic guidance up to the ascending aorta. The wire was  pulled out. The catheter was aspirated and connected to the manifold. The  catheter was engaged and was further advanced and engaged into the saphenous  vein graft to OM2. Multiple views of this graft were taken   Next the catheter was disengaged and was engaged into the saphenous vein  graft through the diagonal 1. Multiple views of this graft were taken.   Next the catheter was disengaged and engaged into the saphenous vein graft  to the RCA. Multiple views of this graft were taken.   Next the catheter was disengaged and was engaged into  the LIMA graft to the  LAD. This catheter was exchanged over the wire to the LIMA for a diagnostic  catheter which was engaged into the LIMA to the LAD.  Multiple views of this  graft were taken.   Next the LIMA catheter was pulled out over the wire and was replaced with a  6 French pigtail catheter which was advanced over the wire under  fluoroscopic guidance up to the ascending aorta. The wire was pulled out.  The catheter was aspirated and connected to the manifold. The catheter was  further advanced across the aortic valve into the LV and LV pressures were  recorded.  Next an left ventriculogram was done in the 30 degree RAO  position. Post angiographic pressures were recorded from the left ventricle  and then pullback pressures were recorded from the aorta. There was no  gradient across the aortic valve. Next the pigtail catheter was pulled out  over the wire. Sheaths were aspirated and flushed.   FINDINGS:  1. The LV showed mild anterolateral wall dyskinesia, EF of 55% to 60%. The     left main was patent. The LAD was 100% occluded proximally. The left     circumflex had 40% to 50% proximal long diffuse disease and 80% to 90%     long distal stenosis. The OM1 is less than 1 mm and has 70% to 80% ostial     stenosis. The OM2 is filling by the saphenous vein graft. The OM3 is     very, very small which is diffusely diseased. The RCA is 100% occluded     proximally from prior angiogram. The saphenous vein graft to diagonal 1     has 90% to 95% proximal stenosis with TIMI grade 2 distal flow in the     native distal diagonal, which appears to be the culprit lesion for her     small non Q wave MI. The saphenous vein graft to the OM2 was patent. The     saphenous vein graft to the RCA has 30% to 40% distal anastomotic     stenosis. The LIMA to the LAD was patent.   INTERVENTIONAL PROCEDURE:  Successful PTCA to the proximal saphenous vein graft to the diagonal 1 was done using a 3.0 x  20 mm long Maverick balloon  for predilation and then a 3.5 x 24 mm long Taxis drug  eluding stent was  deployed at 10 atmospheres of pressure which was fully expanded going up to  15 atmospheres pressure. The lesion dilated from 90% to 95% to 0% residual  with excellent TIMI grade 3 distal flow without evidence of dissection or  distal embolization. The patient received weight based heparin, Integrilin  and was continued on Plavix and aspirin during the procedure.   The patient tolerated the procedure well. There were no complications. The  patient was transferred to the recovery room in stable condition.                                               Allegra Lai. Terrence Dupont, M.D.    MNH/MEDQ  D:  10/25/2002  T:  10/26/2002  Job:  [id number]

## 2010-11-07 NOTE — Op Note (Signed)
[place] [place]  Patient:    Diane Steele, Diane Steele                       MRN: [id number] Proc. Date: 05/06/00 Adm. Date:  [id number] Disc. Date: [id number] Attending:  Sherolyn Buba                           Operative Report  PREOPERATIVE DIAGNOSIS:  Sebaceous cyst - right anterior chest wall.  POSTOPERATIVE DIAGNOSIS:  Sebaceous cyst - right anterior chest wall.  PROCEDURE:  Excision of sebaceous cyst - right anterior chest wall.  SURGEON:  Candee Furbish. Bubba Camp, M.D.  ASSISTANT:  Nurse.  ANESTHESIA:  MAC.  I used 1% Xylocaine with epinephrine.  INDICATIONS:  Patient is a 50 year old woman with an enlarging painful sebaceous cyst, which has been infected on several previous occasions.  After appropriate course of antibiotics, she is brought to the operating room now for removal.  DESCRIPTION OF PROCEDURE:  Following sedation, the patient was positioned supinely, and the right anterior chest wall prepped and draped to be included in a sterile operative field.  A transversely placed elliptical incision was made around the sebaceous cyst, this was deepened through the skin and subcutaneous tissues allowing superior and inferior flaps so as to incorporate the entire cyst in the excision.  The entire mass was excised widely and removed and forwarded for pathologic evaluation.  Hemostasis was obtained by electrocautery.  Sponge, instrument and sharp counts verified.  Subcutaneous tissues closed with interrupted 3-0 Vicryl sutures.  Skin closed with a running 4-0 Monocryl suture and then reinforced with Steri-Strips.  Sterile dressings applied.  Anesthetic reversed, patient removed from the operating room to the recovery room in stable condition having tolerated the procedure well. DD:  05/06/00 TD:  05/06/00 Job: [id number]

## 2011-03-30 LAB — I-STAT 8, (EC8 V) (CONVERTED LAB)
Acid-base deficit: 1
Chloride: 104
Glucose, Bld: 201 — ABNORMAL HIGH
Hemoglobin: 14.3
Potassium: 4.2
Sodium: 136
TCO2: 25

## 2011-03-30 LAB — POCT URINALYSIS DIP (DEVICE)
Hgb urine dipstick: NEGATIVE
Protein, ur: >=300 — AB
Specific Gravity, Urine: >=1.03
Urobilinogen, UA: 0.2

## 2011-03-30 LAB — CBC
HCT: 37.2
Hemoglobin: 12.6
RDW: 14.9
WBC: 5.1

## 2011-03-30 LAB — DIFFERENTIAL
Basophils Absolute: 0
Lymphocytes Relative: 26
Lymphs Abs: 1.3
Monocytes Absolute: 0.4
Neutro Abs: 3.3

## 2011-03-30 LAB — B-NATRIURETIC PEPTIDE (CONVERTED LAB): Pro B Natriuretic peptide (BNP): 123 — ABNORMAL HIGH

## 2011-03-30 LAB — POCT I-STAT CREATININE: Operator id: 239701

## 2011-05-25 ENCOUNTER — Other Ambulatory Visit: Payer: Self-pay | Admitting: Cardiology

## 2011-07-20 ENCOUNTER — Other Ambulatory Visit (HOSPITAL_COMMUNITY): Payer: Self-pay | Admitting: Obstetrics

## 2011-07-20 DIAGNOSIS — Z1231 Encounter for screening mammogram for malignant neoplasm of breast: Secondary | ICD-10-CM

## 2011-08-17 ENCOUNTER — Ambulatory Visit (HOSPITAL_COMMUNITY)
Admission: RE | Admit: 2011-08-17 | Discharge: 2011-08-17 | Disposition: A | Discharge status: Home / Self Care | Payer: Medicare Other | Source: Ambulatory Visit | Attending: Obstetrics | Admitting: Obstetrics

## 2011-08-17 DIAGNOSIS — Z1231 Encounter for screening mammogram for malignant neoplasm of breast: Secondary | ICD-10-CM | POA: Insufficient documentation

## 2011-09-12 ENCOUNTER — Other Ambulatory Visit: Payer: Self-pay | Admitting: Cardiology

## 2011-09-17 ENCOUNTER — Other Ambulatory Visit: Payer: Self-pay | Admitting: Cardiology

## 2011-10-14 ENCOUNTER — Other Ambulatory Visit: Payer: Self-pay | Admitting: Cardiology

## 2012-01-21 ENCOUNTER — Other Ambulatory Visit: Payer: Self-pay | Admitting: Cardiology

## 2012-02-13 ENCOUNTER — Other Ambulatory Visit: Payer: Self-pay | Admitting: Cardiology

## 2012-11-23 ENCOUNTER — Other Ambulatory Visit (HOSPITAL_COMMUNITY): Payer: Self-pay | Admitting: *Deleted

## 2012-11-23 DIAGNOSIS — Z1231 Encounter for screening mammogram for malignant neoplasm of breast: Secondary | ICD-10-CM

## 2012-11-30 ENCOUNTER — Ambulatory Visit (HOSPITAL_COMMUNITY): Payer: Medicare Other | Attending: *Deleted

## 2013-01-13 ENCOUNTER — Encounter (HOSPITAL_COMMUNITY): Payer: Self-pay | Admitting: Emergency Medicine

## 2013-01-13 ENCOUNTER — Ambulatory Visit (HOSPITAL_COMMUNITY): Payer: Medicare Other

## 2013-01-13 ENCOUNTER — Inpatient Hospital Stay (HOSPITAL_COMMUNITY)
Admission: EM | Admit: 2013-01-13 | Discharge: 2013-01-16 | DRG: 309 | Disposition: A | Discharge status: Home / Self Care | Payer: Medicare Other | Attending: Cardiology | Admitting: Cardiology

## 2013-01-13 ENCOUNTER — Emergency Department (HOSPITAL_COMMUNITY): Payer: Medicare Other

## 2013-01-13 DIAGNOSIS — E1142 Type 2 diabetes mellitus with diabetic polyneuropathy: Secondary | ICD-10-CM | POA: Diagnosis present

## 2013-01-13 DIAGNOSIS — Z6841 Body Mass Index (BMI) 40.0 and over, adult: Secondary | ICD-10-CM

## 2013-01-13 DIAGNOSIS — I2510 Atherosclerotic heart disease of native coronary artery without angina pectoris: Secondary | ICD-10-CM | POA: Diagnosis present

## 2013-01-13 DIAGNOSIS — Z87891 Personal history of nicotine dependence: Secondary | ICD-10-CM

## 2013-01-13 DIAGNOSIS — I209 Angina pectoris, unspecified: Secondary | ICD-10-CM | POA: Diagnosis present

## 2013-01-13 DIAGNOSIS — E1165 Type 2 diabetes mellitus with hyperglycemia: Secondary | ICD-10-CM | POA: Diagnosis present

## 2013-01-13 DIAGNOSIS — E7800 Pure hypercholesterolemia, unspecified: Secondary | ICD-10-CM | POA: Diagnosis present

## 2013-01-13 DIAGNOSIS — Z951 Presence of aortocoronary bypass graft: Secondary | ICD-10-CM

## 2013-01-13 DIAGNOSIS — N058 Unspecified nephritic syndrome with other morphologic changes: Secondary | ICD-10-CM | POA: Diagnosis present

## 2013-01-13 DIAGNOSIS — I10 Essential (primary) hypertension: Secondary | ICD-10-CM | POA: Diagnosis present

## 2013-01-13 DIAGNOSIS — I200 Unstable angina: Secondary | ICD-10-CM

## 2013-01-13 DIAGNOSIS — Z9861 Coronary angioplasty status: Secondary | ICD-10-CM

## 2013-01-13 DIAGNOSIS — Z7902 Long term (current) use of antithrombotics/antiplatelets: Secondary | ICD-10-CM

## 2013-01-13 DIAGNOSIS — Z7982 Long term (current) use of aspirin: Secondary | ICD-10-CM

## 2013-01-13 DIAGNOSIS — E1149 Type 2 diabetes mellitus with other diabetic neurological complication: Secondary | ICD-10-CM | POA: Diagnosis present

## 2013-01-13 DIAGNOSIS — E1129 Type 2 diabetes mellitus with other diabetic kidney complication: Secondary | ICD-10-CM | POA: Diagnosis present

## 2013-01-13 DIAGNOSIS — Z79899 Other long term (current) drug therapy: Secondary | ICD-10-CM

## 2013-01-13 DIAGNOSIS — I4891 Unspecified atrial fibrillation: Principal | ICD-10-CM | POA: Diagnosis present

## 2013-01-13 HISTORY — DX: Major depressive disorder, single episode, unspecified: F32.9

## 2013-01-13 HISTORY — DX: Atherosclerotic heart disease of native coronary artery without angina pectoris: I25.10

## 2013-01-13 HISTORY — DX: Essential (primary) hypertension: I10

## 2013-01-13 HISTORY — DX: Anxiety disorder, unspecified: F41.9

## 2013-01-13 HISTORY — DX: Heart failure, unspecified: I50.9

## 2013-01-13 HISTORY — DX: Angina pectoris, unspecified: I20.9

## 2013-01-13 HISTORY — DX: Pure hypercholesterolemia, unspecified: E78.00

## 2013-01-13 HISTORY — DX: Depression, unspecified: F32.A

## 2013-01-13 HISTORY — DX: Acute myocardial infarction, unspecified: I21.9

## 2013-01-13 HISTORY — DX: Type 2 diabetes mellitus without complications: E11.9

## 2013-01-13 HISTORY — DX: Shortness of breath: R06.02

## 2013-01-13 HISTORY — DX: Unspecified osteoarthritis, unspecified site: M19.90

## 2013-01-13 HISTORY — DX: Gastro-esophageal reflux disease without esophagitis: K21.9

## 2013-01-13 HISTORY — DX: Myoneural disorder, unspecified: G70.9

## 2013-01-13 LAB — CBC
HCT: 39.9 % (ref 36.0–46.0)
Hemoglobin: 13.9 g/dL (ref 12.0–15.0)
MCH: 29.1 pg (ref 26.0–34.0)
MCHC: 34.8 g/dL (ref 30.0–36.0)
MCV: 83.5 fL (ref 78.0–100.0)
Platelets: 263 10*3/uL (ref 150–400)
RBC: 4.78 MIL/uL (ref 3.87–5.11)
RDW: 13.8 % (ref 11.5–15.5)
WBC: 8.7 10*3/uL (ref 4.0–10.5)

## 2013-01-13 LAB — COMPREHENSIVE METABOLIC PANEL
ALT: 16 U/L (ref 0–35)
AST: 13 U/L (ref 0–37)
Albumin: 3.2 g/dL — ABNORMAL LOW (ref 3.5–5.2)
Calcium: 8.7 mg/dL (ref 8.4–10.5)
Chloride: 100 mEq/L (ref 96–112)
Creatinine, Ser: 0.64 mg/dL (ref 0.50–1.10)
Sodium: 134 mEq/L — ABNORMAL LOW (ref 135–145)

## 2013-01-13 LAB — URINE MICROSCOPIC-ADD ON

## 2013-01-13 LAB — TROPONIN I
Troponin I: <0.3 ng/mL (ref <0.30)
Troponin I: <0.3 ng/mL (ref <0.30)
Troponin I: <0.3 ng/mL (ref <0.30)

## 2013-01-13 LAB — URINALYSIS, ROUTINE W REFLEX MICROSCOPIC
Glucose, UA: >1000 mg/dL — AB
Ketones, ur: NEGATIVE mg/dL
Nitrite: NEGATIVE
Protein, ur: NEGATIVE mg/dL
Urobilinogen, UA: 1 mg/dL (ref 0.0–1.0)

## 2013-01-13 LAB — HEPARIN LEVEL (UNFRACTIONATED): Heparin Unfractionated: 0.69 IU/mL (ref 0.30–0.70)

## 2013-01-13 LAB — CBC WITH DIFFERENTIAL/PLATELET
Basophils Absolute: 0 10*3/uL (ref 0.0–0.1)
Basophils Relative: 0 % (ref 0–1)
Eosinophils Absolute: 0.1 10*3/uL (ref 0.0–0.7)
Eosinophils Relative: 1 % (ref 0–5)
HCT: 34.9 % — ABNORMAL LOW (ref 36.0–46.0)
MCH: 28 pg (ref 26.0–34.0)
MCHC: 33.8 g/dL (ref 30.0–36.0)
MCV: 82.9 fL (ref 78.0–100.0)
Monocytes Absolute: 0.3 10*3/uL (ref 0.1–1.0)
RDW: 14 % (ref 11.5–15.5)

## 2013-01-13 LAB — BASIC METABOLIC PANEL
BUN: 20 mg/dL (ref 6–23)
CO2: 21 mEq/L (ref 19–32)
Calcium: 9.9 mg/dL (ref 8.4–10.5)
Chloride: 93 mEq/L — ABNORMAL LOW (ref 96–112)
Creatinine, Ser: 0.84 mg/dL (ref 0.50–1.10)
GFR calc Af Amer: >90 mL/min (ref >90)
GFR calc non Af Amer: 79 mL/min — ABNORMAL LOW (ref >90)
Glucose, Bld: 566 mg/dL (ref 70–99)
Potassium: 4.3 mEq/L (ref 3.5–5.1)
Sodium: 131 mEq/L — ABNORMAL LOW (ref 135–145)

## 2013-01-13 LAB — GLUCOSE, CAPILLARY
Glucose-Capillary: 346 mg/dL — ABNORMAL HIGH (ref 70–99)
Glucose-Capillary: 294 mg/dL — ABNORMAL HIGH (ref 70–99)
Glucose-Capillary: 201 mg/dL — ABNORMAL HIGH (ref 70–99)

## 2013-01-13 LAB — APTT: aPTT: 128 seconds — ABNORMAL HIGH (ref 24–37)

## 2013-01-13 MED ORDER — INSULIN GLARGINE 100 UNIT/ML ~~LOC~~ SOLN: 10.0000 [IU] | Freq: Once | SUBCUTANEOUS | Status: DC

## 2013-01-13 MED ORDER — ASPIRIN 325 MG PO TABS
325.0000 mg | ORAL_TABLET | Freq: Every day | ORAL | Status: DC
  Administered 2013-01-13: 325 mg via ORAL
  Filled 2013-01-13 (×2): qty 1

## 2013-01-13 MED ORDER — ASPIRIN EC 81 MG PO TBEC: 81.0000 mg | DELAYED_RELEASE_TABLET | Freq: Every day | ORAL | Status: DC

## 2013-01-13 MED ORDER — INSULIN GLARGINE 100 UNIT/ML ~~LOC~~ SOLN
10.0000 [IU] | Freq: Once | SUBCUTANEOUS | Status: DC
  Filled 2013-01-13: qty 0.1

## 2013-01-13 MED ORDER — ACETAMINOPHEN 325 MG PO TABS
650.0000 mg | ORAL_TABLET | ORAL | Status: DC | PRN
  Administered 2013-01-14 – 2013-01-15 (×2): 650 mg via ORAL
  Filled 2013-01-13: qty 2

## 2013-01-13 MED ORDER — DEXTROSE 5 % IV SOLN: 60.0000 mg/h | Freq: Once | INTRAVENOUS | Status: DC

## 2013-01-13 MED ORDER — ASPIRIN 81 MG PO CHEW: 324.0000 mg | CHEWABLE_TABLET | ORAL | Status: AC

## 2013-01-13 MED ORDER — ATORVASTATIN CALCIUM 20 MG PO TABS
20.0000 mg | ORAL_TABLET | Freq: Every day | ORAL | Status: DC
  Administered 2013-01-13 – 2013-01-15 (×3): 20 mg via ORAL
  Filled 2013-01-13 (×4): qty 1

## 2013-01-13 MED ORDER — ONDANSETRON HCL 4 MG/2ML IJ SOLN: 4.0000 mg | Freq: Four times a day (QID) | INTRAMUSCULAR | Status: DC | PRN

## 2013-01-13 MED ORDER — METOPROLOL TARTRATE 25 MG PO TABS
25.0000 mg | ORAL_TABLET | Freq: Two times a day (BID) | ORAL | Status: DC
  Administered 2013-01-13 – 2013-01-16 (×7): 25 mg via ORAL
  Filled 2013-01-13 (×9): qty 1

## 2013-01-13 MED ORDER — DILTIAZEM HCL 100 MG IV SOLR: 10.0000 mg/h | Freq: Once | INTRAVENOUS | Status: DC

## 2013-01-13 MED ORDER — NITROGLYCERIN 0.4 MG SL SUBL: 0.4000 mg | SUBLINGUAL_TABLET | SUBLINGUAL | Status: DC | PRN

## 2013-01-13 MED ORDER — ASPIRIN EC 81 MG PO TBEC
81.0000 mg | DELAYED_RELEASE_TABLET | Freq: Every day | ORAL | Status: DC
  Administered 2013-01-14 – 2013-01-16 (×3): 81 mg via ORAL
  Filled 2013-01-13 (×3): qty 1

## 2013-01-13 MED ORDER — DILTIAZEM HCL 25 MG/5ML IV SOLN
10.0000 mg | Freq: Once | INTRAVENOUS | Status: AC
  Administered 2013-01-13: 10 mg via INTRAVENOUS
  Filled 2013-01-13: qty 5

## 2013-01-13 MED ORDER — AMIODARONE IV BOLUS ONLY 150 MG/100ML
150.0000 mg | Freq: Once | INTRAVENOUS | Status: AC
  Administered 2013-01-13: 150 mg via INTRAVENOUS
  Filled 2013-01-13: qty 100

## 2013-01-13 MED ORDER — CLOPIDOGREL BISULFATE 75 MG PO TABS
75.0000 mg | ORAL_TABLET | Freq: Every day | ORAL | Status: DC
  Administered 2013-01-13 – 2013-01-14 (×2): 75 mg via ORAL
  Filled 2013-01-13 (×2): qty 1

## 2013-01-13 MED ORDER — RAMIPRIL 10 MG PO CAPS
10.0000 mg | ORAL_CAPSULE | Freq: Every day | ORAL | Status: DC
  Administered 2013-01-13 – 2013-01-16 (×4): 10 mg via ORAL
  Filled 2013-01-13 (×4): qty 1

## 2013-01-13 MED ORDER — METFORMIN HCL 500 MG PO TABS
1000.0000 mg | ORAL_TABLET | Freq: Two times a day (BID) | ORAL | Status: DC
  Administered 2013-01-13 – 2013-01-16 (×5): 1000 mg via ORAL
  Filled 2013-01-13 (×8): qty 2

## 2013-01-13 MED ORDER — INSULIN GLARGINE 100 UNIT/ML ~~LOC~~ SOLN
10.0000 [IU] | Freq: Once | SUBCUTANEOUS | Status: AC
  Administered 2013-01-13: 10 [IU] via SUBCUTANEOUS

## 2013-01-13 MED ORDER — ASPIRIN 300 MG RE SUPP
300.0000 mg | RECTAL | Status: AC
  Filled 2013-01-13: qty 1

## 2013-01-13 MED ORDER — LINAGLIPTIN 5 MG PO TABS
5.0000 mg | ORAL_TABLET | Freq: Every day | ORAL | Status: DC
  Administered 2013-01-13 – 2013-01-16 (×4): 5 mg via ORAL
  Filled 2013-01-13 (×4): qty 1

## 2013-01-13 MED ORDER — AMIODARONE HCL IN DEXTROSE 360-4.14 MG/200ML-% IV SOLN
60.0000 mg/h | INTRAVENOUS | Status: AC
  Administered 2013-01-13 (×2): 60 mg/h via INTRAVENOUS
  Filled 2013-01-13 (×2): qty 200

## 2013-01-13 MED ORDER — AMIODARONE HCL IN DEXTROSE 360-4.14 MG/200ML-% IV SOLN
30.0000 mg/h | INTRAVENOUS | Status: DC
  Administered 2013-01-13 – 2013-01-14 (×2): 30 mg/h via INTRAVENOUS
  Filled 2013-01-13 (×5): qty 200

## 2013-01-13 MED ORDER — SODIUM CHLORIDE 0.9 % IV BOLUS (SEPSIS)
1000.0000 mL | Freq: Once | INTRAVENOUS | Status: AC
  Administered 2013-01-13: 1000 mL via INTRAVENOUS

## 2013-01-13 MED ORDER — HEPARIN (PORCINE) IN NACL 100-0.45 UNIT/ML-% IJ SOLN
1200.0000 [IU]/h | INTRAMUSCULAR | Status: DC
  Administered 2013-01-13 (×2): 1300 [IU]/h via INTRAVENOUS
  Administered 2013-01-13 – 2013-01-15 (×3): 1200 [IU]/h via INTRAVENOUS
  Filled 2013-01-13 (×3): qty 250

## 2013-01-13 MED ORDER — INSULIN ASPART 100 UNIT/ML ~~LOC~~ SOLN
0.0000 [IU] | Freq: Three times a day (TID) | SUBCUTANEOUS | Status: DC
  Administered 2013-01-13: 8 [IU] via SUBCUTANEOUS
  Administered 2013-01-14: 5 [IU] via SUBCUTANEOUS
  Administered 2013-01-14: 8 [IU] via SUBCUTANEOUS
  Administered 2013-01-15: 5 [IU] via SUBCUTANEOUS
  Administered 2013-01-15: 8 [IU] via SUBCUTANEOUS
  Administered 2013-01-16: 3 [IU] via SUBCUTANEOUS
  Administered 2013-01-16: 5 [IU] via SUBCUTANEOUS

## 2013-01-13 MED ORDER — GLIPIZIDE 10 MG PO TABS
10.0000 mg | ORAL_TABLET | Freq: Two times a day (BID) | ORAL | Status: DC
  Administered 2013-01-13 – 2013-01-16 (×5): 10 mg via ORAL
  Filled 2013-01-13 (×9): qty 1

## 2013-01-13 MED ORDER — HEPARIN BOLUS VIA INFUSION
5000.0000 [IU] | Freq: Once | INTRAVENOUS | Status: AC
  Administered 2013-01-13: 5000 [IU] via INTRAVENOUS

## 2013-01-13 NOTE — H&P (Signed)
Diane Steele is an 52 y.o. female.   Chief Complaint: Chest pain/palpitations HPI: Patient is 52 year old female with past medical history significant for coronary artery disease status post CABG in the past status post PCI S. in the past, hypertension, non-insulin-dependent diabetes matters, diabetic neuropathy, hypercholesteremia, morbid obesity, came to the ER complaining of retrosternal chest pain described as heaviness off and on radiating to right arm associated nausea and diaphoresis. Also complaints of palpitation off and on for last few weeks associated with feeling dizzy tired and no energy. Patient was noted to be in A. fib with RVR subsequently spontaneously converted to sinus rhythm after Cardizem bolus and then was started on IV amiodarone and drip. Patient was seen in my office the approximately 4 months ago for recurrent palpitations but was noted to be in sinus rhythm and was supposed to that event monitor but missed her appointment. Patient denies any history of PND orthopnea but complains of occasional leg swelling. States lately her blood sugar has been staying very high above 300.  Past Medical History  Diagnosis Date  . Diabetes mellitus without complication   . High cholesterol   . Hypertension   . Coronary artery disease     Past Surgical History  Procedure Laterality Date  . Coronary stent placement    . Coronary artery bypass graft      No family history on file. Social History:  reports that she has quit smoking. She does not have any smokeless tobacco history on file. She reports that  drinks alcohol. Her drug history is not on file.  Allergies: No Known Allergies   (Not in a hospital admission)  Results for orders placed during the hospital encounter of 01/13/13 (from the past 48 hour(s))  GLUCOSE, CAPILLARY     Status: Abnormal   Collection Time    01/13/13  8:07 AM      Result Value Range   Glucose-Capillary 494 (*) 70 - 99 mg/dL  CBC     Status: None    Collection Time    01/13/13  8:21 AM      Result Value Range   WBC 8.7  4.0 - 10.5 K/uL   RBC 4.78  3.87 - 5.11 MIL/uL   Hemoglobin 13.9  12.0 - 15.0 g/dL   HCT 39.9  36.0 - 46.0 %   MCV 83.5  78.0 - 100.0 fL   MCH 29.1  26.0 - 34.0 pg   MCHC 34.8  30.0 - 36.0 g/dL   RDW 13.8  11.5 - 15.5 %   Platelets 263  150 - 400 K/uL  BASIC METABOLIC PANEL     Status: Abnormal   Collection Time    01/13/13  8:21 AM      Result Value Range   Sodium 131 (*) 135 - 145 mEq/L   Potassium 4.3  3.5 - 5.1 mEq/L   Chloride 93 (*) 96 - 112 mEq/L   CO2 21  19 - 32 mEq/L   Glucose, Bld 566 (*) 70 - 99 mg/dL   Comment: CRITICAL RESULT CALLED TO, READ BACK BY AND VERIFIED WITH:     C.ROUGHGARDEN,RN 01/13/13 0912 BY BSLADE   BUN 20  6 - 23 mg/dL   Creatinine, Ser 0.84  0.50 - 1.10 mg/dL   Calcium 9.9  8.4 - 10.5 mg/dL   GFR calc non Af Amer 79 (*) >90 mL/min   GFR calc Af Amer >90  >90 mL/min   Comment:  The eGFR has been calculated     using the CKD EPI equation.     This calculation has not been     validated in all clinical     situations.     eGFR's persistently     <90 mL/min signify     possible Chronic Kidney Disease.  TROPONIN I     Status: None   Collection Time    01/13/13  8:21 AM      Result Value Range   Troponin I <0.30  <0.30 ng/mL   Comment:            Due to the release kinetics of cTnI,     a negative result within the first hours     of the onset of symptoms does not rule out     myocardial infarction with certainty.     If myocardial infarction is still suspected,     repeat the test at appropriate intervals.   Dg Chest Port 1 View  01/13/2013   *RADIOLOGY REPORT*  Clinical Data: Tachycardia.  Palpitations.  Chest pain.  PORTABLE CHEST - 1 VIEW  Comparison: 06/01/2007.  Findings:  Cardiopericardial silhouette within normal limits. Mediastinal contours normal. Trachea midline.  No airspace disease or effusion. Monitoring leads are projected over the chest.Mild  apical lordotic projection.CABG/median sternotomy.  IMPRESSION: No active cardiopulmonary disease.  Postoperative changes CABG.   Original Report Authenticated By: Dereck Ligas, M.D.    Review of Systems  Constitutional: Negative for fever and chills.  Eyes: Negative for blurred vision and double vision.  Respiratory: Negative for cough, hemoptysis and sputum production.   Cardiovascular: Positive for chest pain, palpitations and leg swelling. Negative for orthopnea and claudication.  Gastrointestinal: Positive for nausea. Negative for vomiting, abdominal pain and diarrhea.  Genitourinary: Negative for dysuria.  Neurological: Positive for dizziness. Negative for headaches.    Blood pressure 118/81, pulse 76, temperature 98 F (36.7 C), resp. rate 18, height 5\' 6"  (1.676 m), weight 112.492 kg (248 lb), SpO2 100.00%. Physical Exam  Constitutional: She is oriented to person, place, and time.  HENT:  Head: Normocephalic and atraumatic.  Eyes: Conjunctivae are normal. Pupils are equal, round, and reactive to light. Left eye exhibits no discharge. No scleral icterus.  Neck: Neck supple. No JVD present. No tracheal deviation present. No thyromegaly present.  Cardiovascular: Normal rate and regular rhythm.   Murmur (Soft systolic murmur noted) heard. Respiratory: Effort normal and breath sounds normal. No respiratory distress. She has no wheezes. She has no rales.  GI: Soft. Bowel sounds are normal. She exhibits distension. There is no tenderness. There is no rebound.  Musculoskeletal:  No clubbing cyanosis 1+ edema  Neurological: She is alert and oriented to person, place, and time.     Assessment/Plan Recurrent paroxysmal atrial fibrillation with RVR Unstable angina rule out MI Hypertension Uncontrolled diabetes matters Morbid obesity Hypercholesteremia Plan As per orders  Kery Haltiwanger N 01/13/2013, 10:44 AM

## 2013-01-13 NOTE — ED Notes (Addendum)
Patient transported to CT 

## 2013-01-13 NOTE — ED Notes (Signed)
statesyesterday started to have cp and feeling lightheaded and wants to collapse and then started to cough this am  And before cough started  To have cp and then got lightheaded again her sugars have been high she has been taking her meds

## 2013-01-13 NOTE — ED Notes (Signed)
EKG completed signed by EDP and shown to Dr Terrence Dupont.

## 2013-01-13 NOTE — Progress Notes (Signed)
ANTICOAGULATION CONSULT NOTE - Follow up [place] for heparin Indication: atrial fibrillation  No Known Allergies  Patient Measurements: Height: 5\' 6"  (167.6 cm) Weight: 248 lb (112.492 kg) IBW/kg (Calculated) : 59.3 Heparin Dosing Weight: 86 kg  Vital Signs: Temp: 98.2 F (36.8 C) (07/25 1425) Temp src: Oral (07/25 1425) BP: 116/68 mmHg (07/25 1425) Pulse Rate: 72 (07/25 1425)  Labs:  Recent Labs  01/13/13 0821 01/13/13 1400 01/13/13 1714  HGB 13.9 11.8*  --   HCT 39.9 34.9*  --   PLT 263 202  --   APTT  --  128*  --   LABPROT  --  13.8  --   INR  --  1.08  --   HEPARINUNFRC  --   --  0.69  CREATININE 0.84 0.64  --   TROPONINI <0.30 <0.30  --     Estimated Creatinine Clearance: 105.9 ml/min (by C-G formula based on Cr of 0.64).   Medical History: Past Medical History  Diagnosis Date  . Diabetes mellitus without complication   . High cholesterol   . Hypertension   . Coronary artery disease   . Myocardial infarction     " MILD "  IN 1997  . Anginal pain   . Anxiety   . Depression   . Shortness of breath   . CHF (congestive heart failure)   . GERD (gastroesophageal reflux disease)   . Neuromuscular disorder     DIABETIC NEUROPATHY  . Arthritis     Assessment: 52 y/o female patient receiving heparin gtt for afib. First Xa level therapeutic but on higher side of goal. Will reduce rate.   Goal of Therapy:  Heparin level 0.3-0.7 units/ml Monitor platelets by anticoagulation protocol: Yes   Plan:  Decrease heparin to 1200 units/hr and f/u in am.  Davonna Belling, PharmD, BCPS Pager [phone] 01/13/2013 6:04 PM

## 2013-01-13 NOTE — Care Management Note (Unsigned)
    Page 1 of 1   01/16/2013     9:59:58 AM   CARE MANAGEMENT NOTE 01/16/2013  Patient:  Diane Steele, Diane Steele   Account Number:  [id number]  Date Initiated:  01/13/2013  Documentation initiated by:  GRAVES-BIGELOW,Tobie Perdue  Subjective/Objective Assessment:   Pt admitted with afib. Initiated on iv amio.     Action/Plan:   CM will continue to monitor for disposition needs.   Anticipated DC Date:  01/16/2013   Anticipated DC Plan:  HOME/SELF CARE         Choice offered to / List presented to:             Status of service:  In process, will continue to follow Medicare Important Message given?   (If response is "NO", the following Medicare IM given date fields will be blank) Date Medicare IM given:   Date Additional Medicare IM given:    Discharge Disposition:    Per UR Regulation:  Reviewed for med. necessity/level of care/duration of stay  If discussed at [place] of Stay Meetings, dates discussed:    Comments:  01-16-13 [place] RN,BSN [phone] CM did place a benefits check for eliquis. Will make pt aware  of co pay once complete.

## 2013-01-13 NOTE — ED Notes (Signed)
[place]verton aware of blood sugar 566

## 2013-01-13 NOTE — ED Provider Notes (Signed)
CSN: [id number]     Arrival date & time 01/13/13  0802 History     First MD Initiated Contact with Patient 01/13/13 [phone]     Chief Complaint  Patient presents with  . Cough   (Consider location/radiation/quality/duration/timing/severity/associated sxs/prior Treatment) HPI Pt is a 52yo female with hx of CAD, DM II, HTN and high cholesterol presenting today with generalized weakness and "not feeling well" for the past few weeks.  Yesterday pt experienced CP and started feeling lightheaded.  Pt started a dry cough this morning, causing her to become more lightheaded and felt like she was going to collapse.  CP is described as aching pressure in center of her chest, does not radiate, associated with sensation of her heart racing. States palpitations have prevented her from sleeping over past few nights.  Admits to not regularly checking her sugar, last time she checked it was in the 300s.  Reports some nausea but no vomiting or abdominal pain.    PCP: Uvaldo Rising PA-C Cardiology: Dr. Charolette Forward  Past Medical History  Diagnosis Date  . Diabetes mellitus without complication   . High cholesterol   . Hypertension   . Coronary artery disease    Past Surgical History  Procedure Laterality Date  . Coronary stent placement    . Coronary artery bypass graft     No family history on file. History  Substance Use Topics  . Smoking status: Former Research scientist (life sciences)  . Smokeless tobacco: Not on file  . Alcohol Use: Yes   OB History   Grav Para Term Preterm Abortions TAB SAB Ect Mult Living                 Review of Systems  Constitutional: Positive for diaphoresis and fatigue. Negative for fever, chills and appetite change.  Respiratory: Positive for cough and shortness of breath. Negative for choking.   Cardiovascular: Positive for chest pain.  Neurological: Positive for weakness ( generalized) and light-headedness. Negative for dizziness and syncope.    Allergies  Review of patient's  allergies indicates no known allergies.  Home Medications   Current Outpatient Rx  Name  Route  Sig  Dispense  Refill  . aspirin EC 81 MG tablet   Oral   Take 81 mg by mouth daily.         Marland Kitchen atenolol (TENORMIN) 50 MG tablet   Oral   Take 50 mg by mouth daily.         Marland Kitchen atorvastatin (LIPITOR) 20 MG tablet   Oral   Take 20 mg by mouth daily.         . clopidogrel (PLAVIX) 75 MG tablet   Oral   Take 75 mg by mouth daily.         Marland Kitchen glipiZIDE (GLUCOTROL) 10 MG tablet   Oral   Take 10 mg by mouth 2 (two) times daily before a meal.         . metFORMIN (GLUCOPHAGE) 500 MG tablet   Oral   Take 1,000 mg by mouth 2 (two) times daily with a meal.         . Multiple Vitamin (MULTI-VITAMIN DAILY PO)   Oral   Take 1 tablet by mouth daily.         . nitroGLYCERIN (NITROSTAT) 0.4 MG SL tablet   Sublingual   Place 0.4 mg under the tongue every 5 (five) minutes as needed for chest pain.         Marland Kitchen olmesartan (BENICAR)  20 MG tablet   Oral   Take 20 mg by mouth daily.         . potassium chloride (K-DUR) 10 MEQ tablet   Oral   Take 10 mEq by mouth daily.         . ramipril (ALTACE) 10 MG capsule   Oral   Take 10 mg by mouth daily.          BP 118/81  Pulse 76  Temp(Src) 98 F (36.7 C)  Resp 18  Ht 5\' 6"  (1.676 m)  Wt 248 lb (112.492 kg)  BMI 40.05 kg/m2  SpO2 100% Physical Exam  Constitutional: She is oriented to person, place, and time. She appears well-developed and well-nourished. She appears distressed.  Pt is diaphoretic and anxious. No respiratory distress.  HENT:  Head: Normocephalic and atraumatic.  Mouth/Throat: Oropharynx is clear and moist. No oropharyngeal exudate.  Eyes: Conjunctivae are normal. No scleral icterus.  Neck: Normal range of motion. Neck supple.  Cardiovascular: Regular rhythm and normal heart sounds.  Tachycardia present.   Pulmonary/Chest: Breath sounds normal. Tachypnea noted. No respiratory distress. She has no  wheezes. She has no rales. She exhibits no tenderness.  Abdominal: Soft. Bowel sounds are normal. She exhibits no distension and no mass. There is no tenderness. There is no rebound and no guarding.  Musculoskeletal: Normal range of motion.  Neurological: She is alert and oriented to person, place, and time.  Skin: Skin is warm. She is diaphoretic.    ED Course   Procedures (including critical care time)  Labs Reviewed  GLUCOSE, CAPILLARY - Abnormal; Notable for the following:    Glucose-Capillary 494 (*)    All other components within normal limits  BASIC METABOLIC PANEL - Abnormal; Notable for the following:    Sodium 131 (*)    Chloride 93 (*)    Glucose, Bld 566 (*)    GFR calc non Af Amer 79 (*)    All other components within normal limits  GLUCOSE, CAPILLARY - Abnormal; Notable for the following:    Glucose-Capillary 346 (*)    All other components within normal limits  CBC  TROPONIN I  URINALYSIS, ROUTINE W REFLEX MICROSCOPIC  HEPARIN LEVEL (UNFRACTIONATED)   Dg Chest Port 1 View  01/13/2013   *RADIOLOGY REPORT*  Clinical Data: Tachycardia.  Palpitations.  Chest pain.  PORTABLE CHEST - 1 VIEW  Comparison: 06/01/2007.  Findings:  Cardiopericardial silhouette within normal limits. Mediastinal contours normal. Trachea midline.  No airspace disease or effusion. Monitoring leads are projected over the chest.Mild apical lordotic projection.CABG/median sternotomy.  IMPRESSION: No active cardiopulmonary disease.  Postoperative changes CABG.   Original Report Authenticated By: Dereck Ligas, M.D.   No diagnosis found.  MDM  DDx: ACS, DKA.  Pt has poc-CBG of 494.  Labs ordered: CBC, BMP, troponin. CXR.    Tx: fluids, 10U insulin, aspirin, cardizem and amiodarone.  Pt showed afib and RVR on EKG.  Discussed pt with Dr. Lita Mains who consulted cardiology, Dr. Terrence Dupont who will be admitting pt for afib, RVR.     Noland Fordyce, PA-C 01/13/13 1245

## 2013-01-13 NOTE — Care Management (Signed)
UR Completed Elesia Pemberton Graves-Bigelow, RN,BSN [phone]  

## 2013-01-13 NOTE — Progress Notes (Signed)
ANTICOAGULATION CONSULT NOTE - Initial Consult  Pharmacy Consult for heparin Indication: atrial fibrillation  No Known Allergies  Patient Measurements: Height: 5\' 6"  (167.6 cm) Weight: 248 lb (112.492 kg) IBW/kg (Calculated) : 59.3 Heparin Dosing Weight: 86 kg  Vital Signs: Temp: 98 F (36.7 C) (07/25 0821) BP: 116/96 mmHg (07/25 0930) Pulse Rate: 41 (07/25 0930)  Labs:  Recent Labs  01/13/13 0821  HGB 13.9  HCT 39.9  PLT 263  CREATININE 0.84  TROPONINI <0.30    Estimated Creatinine Clearance: 100.8 ml/min (by C-G formula based on Cr of 0.84).   Medical History: Past Medical History  Diagnosis Date  . Diabetes mellitus without complication   . High cholesterol   . Hypertension   . Coronary artery disease     Assessment: 43 yof here for cough.  Found to have Afib and started on heparin.  No anticoagulation therapy PTA. CBC stable. Renal function stable. No sign of bleeding noted.   Goal of Therapy:  Heparin level 0.3-0.7 units/ml Monitor platelets by anticoagulation protocol: Yes   Plan:  1. Heparin Bolus 5000 units 2. Heparin drip 1300 units/hr 3. Check 6 hr heparin level 4. Daily heparin level and CBC 5. Follow-up plan for long term anticoagulation if necessary  Thank you, Vivia Ewing, PharmD Clinical Pharmacist - Resident Pager: [phone] Pharmacy: [phone] 01/13/2013 10:17 AM

## 2013-01-14 ENCOUNTER — Inpatient Hospital Stay (HOSPITAL_COMMUNITY): Payer: Medicare Other

## 2013-01-14 LAB — GLUCOSE, CAPILLARY
Glucose-Capillary: 271 mg/dL — ABNORMAL HIGH (ref 70–99)
Glucose-Capillary: 278 mg/dL — ABNORMAL HIGH (ref 70–99)
Glucose-Capillary: 217 mg/dL — ABNORMAL HIGH (ref 70–99)
Glucose-Capillary: 87 mg/dL (ref 70–99)

## 2013-01-14 LAB — CBC
Platelets: 190 10*3/uL (ref 150–400)
RBC: 3.99 MIL/uL (ref 3.87–5.11)
WBC: 6.6 10*3/uL (ref 4.0–10.5)

## 2013-01-14 LAB — HEPARIN LEVEL (UNFRACTIONATED): Heparin Unfractionated: 0.5 IU/mL (ref 0.30–0.70)

## 2013-01-14 LAB — HEMOGLOBIN A1C
Hgb A1c MFr Bld: 13.8 % — ABNORMAL HIGH (ref <5.7)
Mean Plasma Glucose: 349 mg/dL — ABNORMAL HIGH (ref <117)

## 2013-01-14 LAB — TROPONIN I: Troponin I: <0.3 ng/mL (ref <0.30)

## 2013-01-14 MED ORDER — ACETAMINOPHEN 325 MG PO TABS
ORAL_TABLET | ORAL | Status: AC
  Filled 2013-01-14: qty 2

## 2013-01-14 MED ORDER — TECHNETIUM TC 99M SESTAMIBI GENERIC - CARDIOLITE
30.0000 | Freq: Once | INTRAVENOUS | Status: AC | PRN
  Administered 2013-01-14: 30 via INTRAVENOUS

## 2013-01-14 MED ORDER — AMIODARONE IV BOLUS ONLY 150 MG/100ML
150.0000 mg | Freq: Once | INTRAVENOUS | Status: AC
  Administered 2013-01-14: 150 mg via INTRAVENOUS
  Filled 2013-01-14: qty 100

## 2013-01-14 MED ORDER — METOPROLOL TARTRATE 1 MG/ML IV SOLN
INTRAVENOUS | Status: AC
  Filled 2013-01-14: qty 5

## 2013-01-14 MED ORDER — OFF THE BEAT BOOK
Freq: Once | Status: AC
  Administered 2013-01-14: 20:00:00
  Filled 2013-01-14: qty 1

## 2013-01-14 MED ORDER — FLUCONAZOLE 100 MG PO TABS
100.0000 mg | ORAL_TABLET | Freq: Every day | ORAL | Status: DC
  Administered 2013-01-14 – 2013-01-16 (×3): 100 mg via ORAL
  Filled 2013-01-14 (×3): qty 1

## 2013-01-14 MED ORDER — ZOLPIDEM TARTRATE 5 MG PO TABS
5.0000 mg | ORAL_TABLET | Freq: Every evening | ORAL | Status: DC | PRN
  Administered 2013-01-14 – 2013-01-15 (×2): 5 mg via ORAL
  Filled 2013-01-14 (×2): qty 1

## 2013-01-14 MED ORDER — REGADENOSON 0.4 MG/5ML IV SOLN
0.4000 mg | Freq: Once | INTRAVENOUS | Status: AC
  Administered 2013-01-14: 0.4 mg via INTRAVENOUS

## 2013-01-14 MED ORDER — INSULIN ASPART 100 UNIT/ML ~~LOC~~ SOLN
4.0000 [IU] | Freq: Once | SUBCUTANEOUS | Status: AC
  Administered 2013-01-14: 4 [IU] via SUBCUTANEOUS

## 2013-01-14 MED ORDER — AMIODARONE HCL 200 MG PO TABS
200.0000 mg | ORAL_TABLET | Freq: Two times a day (BID) | ORAL | Status: DC
  Administered 2013-01-14 (×2): 200 mg via ORAL
  Filled 2013-01-14 (×2): qty 1

## 2013-01-14 MED ORDER — TECHNETIUM TC 99M SESTAMIBI GENERIC - CARDIOLITE
10.0000 | Freq: Once | INTRAVENOUS | Status: AC | PRN
  Administered 2013-01-14: 10 via INTRAVENOUS

## 2013-01-14 MED ORDER — AMIODARONE HCL 200 MG PO TABS
400.0000 mg | ORAL_TABLET | Freq: Two times a day (BID) | ORAL | Status: DC
  Administered 2013-01-15 – 2013-01-16 (×3): 400 mg via ORAL
  Filled 2013-01-14 (×4): qty 2

## 2013-01-14 MED ORDER — METOPROLOL TARTRATE 1 MG/ML IV SOLN
5.0000 mg | Freq: Once | INTRAVENOUS | Status: AC
  Administered 2013-01-14: 5 mg via INTRAVENOUS

## 2013-01-14 NOTE — Progress Notes (Signed)
ANTICOAGULATION CONSULT NOTE - Follow up [place] for heparin Indication: atrial fibrillation  No Known Allergies  Patient Measurements: Height: 5\' 6"  (167.6 cm) Weight: 248 lb (112.492 kg) IBW/kg (Calculated) : 59.3 Heparin Dosing Weight: 86 kg  Vital Signs: Temp: 98.3 F (36.8 C) (07/26 0500) BP: 125/73 mmHg (07/26 0946) Pulse Rate: 87 (07/26 0946)  Labs:  Recent Labs  01/13/13 0821 01/13/13 1400 01/13/13 1714 01/14/13 0155  HGB 13.9 11.8*  --  11.4*  HCT 39.9 34.9*  --  33.5*  PLT 263 202  --  190  APTT  --  128*  --   --   LABPROT  --  13.8  --   --   INR  --  1.08  --   --   HEPARINUNFRC  --   --  0.69 0.50  CREATININE 0.84 0.64  --   --   TROPONINI <0.30 <0.30 <0.30 <0.30    Estimated Creatinine Clearance: 105.9 ml/min (by C-G formula based on Cr of 0.64).   Medical History: Past Medical History  Diagnosis Date  . Diabetes mellitus without complication   . High cholesterol   . Hypertension   . Coronary artery disease   . Myocardial infarction     " MILD "  IN 1997  . Anginal pain   . Anxiety   . Depression   . Shortness of breath   . CHF (congestive heart failure)   . GERD (gastroesophageal reflux disease)   . Neuromuscular disorder     DIABETIC NEUROPATHY  . Arthritis     Assessment: 52 y/o female patient receiving heparin gtt for afib. Heparin level (0.5) is at goal on 1200 units/hr, hgb/plts stable, No bleeding noted per chart.  Goal of Therapy:  Heparin level 0.3-0.7 units/ml Monitor platelets by anticoagulation protocol: Yes   Plan:  Continue heparin 1200 units/hr F/u Am labs  Maryanna Shape, PharmD, BCPS  Clinical Pharmacist  Pager: [phone]   01/14/2013 9:58 AM

## 2013-01-14 NOTE — Progress Notes (Signed)
Subjective:  Patient denies any chest pain or shortness of breath. No further episodes of palpitations remains in sinus rhythm scheduled for nuclear stress test today Objective:  Vital Signs in the last 24 hours: Temp:  [98.2 F (36.8 C)-98.5 F (36.9 C)] 98.3 F (36.8 C) (07/26 0500) Pulse Rate:  [71-92] 87 (07/26 0946) Resp:  [18-20] 20 (07/26 0500) BP: (110-144)/(60-89) 125/73 mmHg (07/26 0946) SpO2:  [100 %] 100 % (07/26 0500)  Intake/Output from previous day: 07/25 0701 - 07/26 0700 In: 480 [P.O.:480] Out: 400 [Urine:400] Intake/Output from this shift: Total I/O In: 0  Out: 250 [Urine:250]  Physical Exam: Neck: no adenopathy, no carotid bruit, no JVD and supple, symmetrical, trachea midline Lungs: clear to auscultation bilaterally Heart: regular rate and rhythm, S1, S2 normal, no murmur, click, rub or gallop Abdomen: soft, non-tender; bowel sounds normal; no masses,  no organomegaly Extremities: No clubbing cyanosis trace edema  Lab Results:  Recent Labs  01/13/13 1400 01/14/13 0155  WBC 6.5 6.6  HGB 11.8* 11.4*  PLT 202 190    Recent Labs  01/13/13 0821 01/13/13 1400  NA 131* 134*  K 4.3 4.3  CL 93* 100  CO2 21 24  GLUCOSE 566* 378*  BUN 20 15  CREATININE 0.84 0.64    Recent Labs  01/13/13 1714 01/14/13 0155  TROPONINI <0.30 <0.30   Hepatic Function Panel  Recent Labs  01/13/13 1400  PROT 6.4  ALBUMIN 3.2*  AST 13  ALT 16  ALKPHOS 80  BILITOT 0.4   No results found for this basename: CHOL,  in the last 72 hours No results found for this basename: PROTIME,  in the last 72 hours  Imaging: Imaging results have been reviewed and Dg Chest Port 1 View  01/13/2013   *RADIOLOGY REPORT*  Clinical Data: Tachycardia.  Palpitations.  Chest pain.  PORTABLE CHEST - 1 VIEW  Comparison: 06/01/2007.  Findings:  Cardiopericardial silhouette within normal limits. Mediastinal contours normal. Trachea midline.  No airspace disease or effusion. Monitoring  leads are projected over the chest.Mild apical lordotic projection.CABG/median sternotomy.  IMPRESSION: No active cardiopulmonary disease.  Postoperative changes CABG.   Original Report Authenticated By: Dereck Ligas, M.D.    Cardiac Studies:  Assessment/Plan:  Status post Recurrent paroxysmal atrial fibrillation with RVR  Unstable angina MI ruled out Hypertension  Uncontrolled diabetes mellitus Morbid obesity  Hypercholesteremia  Plan Switch IV amiodarone to by mouth Continue IV heparin for now Check nuclear stress test results   LOS: 1 day    Diane Steele N 01/14/2013, 10:00 AM

## 2013-01-14 NOTE — Progress Notes (Signed)
Pt to go for stress this am, CBG 271. TC MD instructed to hold Glipizide this AM and give half of SSI. (4u)

## 2013-01-14 NOTE — Progress Notes (Signed)
Patient reported not feeling to well and was feeling palpitations. Patient Afib RVR HR 120's 140's. EKG obtained-confirming Afib RVR. BP 124/47. Notified Dr. Terrence Dupont. New ordered received. Will continue to monitor. Lora Havens RN

## 2013-01-14 NOTE — ED Provider Notes (Signed)
Medical screening examination/treatment/procedure(s) were conducted as a shared visit with non-physician practitioner(s) and myself.  I personally evaluated the patient during the encounter   Julianne Rice, MD 01/14/13 1109

## 2013-01-15 LAB — CBC
HCT: 35.2 % — ABNORMAL LOW (ref 36.0–46.0)
MCHC: 33.8 g/dL (ref 30.0–36.0)
MCV: 84.4 fL (ref 78.0–100.0)
Platelets: 191 10*3/uL (ref 150–400)
RDW: 14.2 % (ref 11.5–15.5)
WBC: 5.9 10*3/uL (ref 4.0–10.5)

## 2013-01-15 LAB — GLUCOSE, CAPILLARY
Glucose-Capillary: 248 mg/dL — ABNORMAL HIGH (ref 70–99)
Glucose-Capillary: 263 mg/dL — ABNORMAL HIGH (ref 70–99)

## 2013-01-15 LAB — URINE CULTURE

## 2013-01-15 LAB — HEPARIN LEVEL (UNFRACTIONATED): Heparin Unfractionated: 0.6 IU/mL (ref 0.30–0.70)

## 2013-01-15 MED ORDER — APIXABAN 5 MG PO TABS
5.0000 mg | ORAL_TABLET | Freq: Two times a day (BID) | ORAL | Status: DC
  Administered 2013-01-15 – 2013-01-16 (×3): 5 mg via ORAL
  Filled 2013-01-15 (×4): qty 1

## 2013-01-15 MED ORDER — ACTIVE PARTNERSHIP FOR HEALTH OF YOUR HEART BOOK
Freq: Once | Status: AC
  Administered 2013-01-15: 17:00:00
  Filled 2013-01-15: qty 1

## 2013-01-15 MED ORDER — LIVING WELL WITH DIABETES BOOK
Freq: Once | Status: AC
  Administered 2013-01-15: 17:00:00
  Filled 2013-01-15: qty 1

## 2013-01-15 NOTE — Plan of Care (Signed)
Problem: Phase III Progression Outcomes Goal: Anticoagulation Therapy per MD order Outcome: Completed/Met Date Met:  01/15/13 Eliquis 1st dose 7/27-10am

## 2013-01-15 NOTE — Plan of Care (Signed)
Problem: Phase III Progression Outcomes Goal: Anticoagulation Therapy per MD order Heparin drip d/c'd per MD order

## 2013-01-15 NOTE — Progress Notes (Addendum)
ANTICOAGULATION CONSULT NOTE - Follow up [place] for heparin Indication: atrial fibrillation  No Known Allergies  Patient Measurements: Height: 5\' 6"  (167.6 cm) Weight: 248 lb (112.492 kg) IBW/kg (Calculated) : 59.3 Heparin Dosing Weight: 86 kg  Vital Signs: Temp: 98.2 F (36.8 C) (07/27 0400) BP: 112/50 mmHg (07/27 0400) Pulse Rate: 62 (07/27 0400)  Labs:  Recent Labs  01/13/13 0821 01/13/13 1400 01/13/13 1714 01/14/13 0155 01/15/13 0445  HGB 13.9 11.8*  --  11.4* 11.9*  HCT 39.9 34.9*  --  33.5* 35.2*  PLT 263 202  --  190 191  APTT  --  128*  --   --   --   LABPROT  --  13.8  --   --   --   INR  --  1.08  --   --   --   HEPARINUNFRC  --   --  0.69 0.50 0.60  CREATININE 0.84 0.64  --   --   --   TROPONINI <0.30 <0.30 <0.30 <0.30  --     Estimated Creatinine Clearance: 105.9 ml/min (by C-G formula based on Cr of 0.64).   Medical History: Past Medical History  Diagnosis Date  . Diabetes mellitus without complication   . High cholesterol   . Hypertension   . Coronary artery disease   . Myocardial infarction     " MILD "  IN 1997  . Anginal pain   . Anxiety   . Depression   . Shortness of breath   . CHF (congestive heart failure)   . GERD (gastroesophageal reflux disease)   . Neuromuscular disorder     DIABETIC NEUROPATHY  . Arthritis     Assessment: 52 y/o female patient receiving heparin gtt for afib. Heparin level (0.6) is at goal on 1200 units/hr, hgb/plts stable, No bleeding noted per chart.  Goal of Therapy:  Heparin level 0.3-0.7 units/ml Monitor platelets by anticoagulation protocol: Yes   Plan:  Continue heparin 1200 units/hr F/u Am labs  Maryanna Shape, PharmD, BCPS  Clinical Pharmacist  Pager: [phone]   01/15/2013 8:37 AM  Addendum:  Pharmacy is consulted to transition IV heparin to Apixaban for anticoagulation. His renal and liver functions are wnl.   Plan:  - Dc IV heparin - Start Apixaban 5mg  PO BID, first  dose at 1000 - Apixaban education with pharmacist

## 2013-01-15 NOTE — Progress Notes (Signed)
Subjective:  Patient denies any chest pain or shortness of breath. Complains of recurrent palpitation noted to have recurrent A. fib with rapid ventricular response on monitor spontaneously converted last night after giving one more dose of IV amiodarone. Amiodarone dose has been increased. Patient again had A. fib with RVR  this morning spontaneously converted to sinus rhythm nuclear stress test has been negative for ischemia  Objective:  Vital Signs in the last 24 hours: Temp:  [98.1 F (36.7 C)-98.4 F (36.9 C)] 98.2 F (36.8 C) (07/27 0400) Pulse Rate:  [62-92] 62 (07/27 0400) Resp:  [18-22] 18 (07/27 0400) BP: (112-144)/(47-89) 112/50 mmHg (07/27 0400) SpO2:  [100 %] 100 % (07/27 0400)  Intake/Output from previous day: 07/26 0701 - 07/27 0700 In: 840 [P.O.:840] Out: 975 [Urine:975] Intake/Output from this shift: Total I/O In: 360 [P.O.:360] Out: -   Physical Exam: Neck: no adenopathy, no carotid bruit, no JVD and supple, symmetrical, trachea midline Lungs: clear to auscultation bilaterally Heart: regular rate and rhythm, S1, S2 normal, no murmur, click, rub or gallop Abdomen: soft, non-tender; bowel sounds normal; no masses,  no organomegaly Extremities: extremities normal, atraumatic, no cyanosis or edema  Lab Results:  Recent Labs  01/14/13 0155 01/15/13 0445  WBC 6.6 5.9  HGB 11.4* 11.9*  PLT 190 191    Recent Labs  01/13/13 0821 01/13/13 1400  NA 131* 134*  K 4.3 4.3  CL 93* 100  CO2 21 24  GLUCOSE 566* 378*  BUN 20 15  CREATININE 0.84 0.64    Recent Labs  01/13/13 1714 01/14/13 0155  TROPONINI <0.30 <0.30   Hepatic Function Panel  Recent Labs  01/13/13 1400  PROT 6.4  ALBUMIN 3.2*  AST 13  ALT 16  ALKPHOS 80  BILITOT 0.4   No results found for this basename: CHOL,  in the last 72 hours No results found for this basename: PROTIME,  in the last 72 hours  Imaging: Imaging results have been reviewed and Nm Myocar Multi W/spect W/wall  Motion / Ef  01/14/2013   *RADIOLOGY REPORT*  Clinical Data:  52 year old with prior history of CABG, presenting with chest pain.  MYOCARDIAL IMAGING WITH SPECT (REST AND PHARMACOLOGIC-STRESS) GATED LEFT VENTRICULAR WALL MOTION STUDY LEFT VENTRICULAR EJECTION FRACTION  Technique:  Standard myocardial SPECT imaging was performed after resting intravenous injection of 8 mCi Tc-69m sestamibi. Subsequently, intravenous infusion of regadenoson was performed under the supervision of the Cardiology staff.  At peak effect of the drug, 25 mCi Tc-69m sestamibi was injected intravenously and standard myocardial SPECT  imaging was performed.  Quantitative gated imaging was also performed to evaluate left ventricular wall motion, and estimate left ventricular ejection fraction.  Comparison:  Nuclear medicine myocardial perfusion imaging 11/27/2009.  Findings: Small amount of subdiaphragmatic activity is present on both the resting and post-regadenoson images, but this does not adversely affect the examination. Immediate post-regadenoson images demonstrate slight diminished uptake in the anteroapical segment which can be explained on the basis of breast attenuation, similar to the prior examination.  Initial resting images with similar findings.  No evidence of reversibility to suggest ischemia. Findings confirmed by the computer generated polar map.  Gated images demonstrate satisfactory thickening throughout the left ventricular myocardium with normal wall motion throughout.  Estimated Q G S ejection fraction measured 64%, with an end- diastolic volume of 75 ml and an end-systolic volume of 27 ml.  IMPRESSION:  1.  No evidence of myocardial ischemia or infarction. 2.  Normal left ventricular  wall motion. 3.  Estimated Q G S ejection fraction 64%.   Original Report Authenticated By: Evangeline Dakin, M.D.   Dg Chest Port 1 View  01/13/2013   *RADIOLOGY REPORT*  Clinical Data: Tachycardia.  Palpitations.  Chest pain.  PORTABLE  CHEST - 1 VIEW  Comparison: 06/01/2007.  Findings:  Cardiopericardial silhouette within normal limits. Mediastinal contours normal. Trachea midline.  No airspace disease or effusion. Monitoring leads are projected over the chest.Mild apical lordotic projection.CABG/median sternotomy.  IMPRESSION: No active cardiopulmonary disease.  Postoperative changes CABG.   Original Report Authenticated By: Dereck Ligas, M.D.    Cardiac Studies:  Assessment/Plan:  Status post Recurrent paroxysmal atrial fibrillation with RVR  Stable angina MI ruled out negative nuclear stress test Hypertension  Uncontrolled diabetes mellitus  Morbid obesity  Hypercholesteremia  Plan DC heparin start eliquis Increased amiodarone as per orders Possible discharge tomorrow if stable and no further episodes of A. fib  LOS: 2 days    Diane Steele N 01/15/2013, 9:14 AM

## 2013-01-16 LAB — CBC
HCT: 35.2 % — ABNORMAL LOW (ref 36.0–46.0)
MCHC: 33.2 g/dL (ref 30.0–36.0)
MCV: 84.4 fL (ref 78.0–100.0)
RDW: 14.3 % (ref 11.5–15.5)

## 2013-01-16 MED ORDER — AMIODARONE HCL 400 MG PO TABS: 400.0000 mg | ORAL_TABLET | Freq: Two times a day (BID) | ORAL | Status: DC

## 2013-01-16 MED ORDER — LINAGLIPTIN 5 MG PO TABS: 5.0000 mg | ORAL_TABLET | Freq: Every day | ORAL | Status: DC

## 2013-01-16 MED ORDER — APIXABAN 5 MG PO TABS: 5.0000 mg | ORAL_TABLET | Freq: Two times a day (BID) | ORAL | Status: DC

## 2013-01-16 MED ORDER — METOPROLOL TARTRATE 25 MG PO TABS: 25.0000 mg | ORAL_TABLET | Freq: Two times a day (BID) | ORAL | Status: DC

## 2013-01-16 MED ORDER — GUAIFENESIN-DM 100-10 MG/5ML PO SYRP
15.0000 mL | ORAL_SOLUTION | Freq: Four times a day (QID) | ORAL | Status: DC | PRN
  Administered 2013-01-16: 15 mL via ORAL
  Filled 2013-01-16: qty 15

## 2013-01-16 NOTE — Discharge Summary (Signed)
  Discharge summary dictated on 01/16/2013 dictation number is [phone]

## 2013-01-16 NOTE — Progress Notes (Signed)
Inpatient Diabetes Program Recommendations  AACE/ADA: New Consensus Statement on Inpatient Glycemic Control (2013)  Target Ranges:  Prepandial:   less than 140 mg/dL      Peak postprandial:   less than 180 mg/dL (1-2 hours)      Critically ill patients:  140 - 180 mg/dL     Results for CRICKETT, STOCKSDALE (MRN [id number]) as of 01/16/2013 13:07  Ref. Range 01/15/2013 07:32 01/15/2013 11:48 01/15/2013 16:23 01/15/2013 20:58  Glucose-Capillary Latest Range: 70-99 mg/dL 248 (H) 263 (H) 84 113 (H)    Results for AIANNA, MERKLINGER (MRN [id number]) as of 01/16/2013 13:07  Ref. Range 01/16/2013 08:20 01/16/2013 11:34  Glucose-Capillary Latest Range: 70-99 mg/dL 218 (H) 188 (H)    Results for JAQUISE, FEIN (MRN [id number]) as of 01/16/2013 13:07  Ref. Range 01/13/2013 14:00  Hemoglobin A1C Latest Range: <5.7 % 13.8 (H)    **Noted patient's A1c well above goal of 7% or less (per ADA Standards).  Patient has requested OP DM education for after d/c.  Placed referral to [facility] and [facility] center for OP DM education follow-up per protocol.  **Noted Dr. Terrence Dupont started patient on a third oral agent to help patient better manage her blood sugars at home (started patient on Tradgenta 5mg  daily).    Patient likely needs insulin, however, can try the addition of Tradgenta and see how she fairs after she sees a CDE for further MNT.  If patient continues to have an A1c greater than goal after 3-6 months, may want to reduce oral agents and initiate insulin.   Will follow while inpatient. Wyn Quaker RN, MSN, CDE Diabetes Coordinator Inpatient Diabetes Program [phone]

## 2013-01-16 NOTE — Discharge Instructions (Signed)
Angina Pectoris Angina pectoris is extreme discomfort in your chest, neck, or arm. Your doctor may call it just angina. It is caused by a lack of oxygen to your heart wall. It may feel like tightness or heavy pressure. It may feel like a crushing or squeezing pain. Some people say it feels like gas. It may go down your shoulders, back, and arms. Some people have symptoms other than pain. These include:  Tiredness.  Shortness of breath.  Cold sweats.  Feeling sick to your stomach (nausea). There are four types of angina:  Stable angina often lasts the same amount of time each time it happens. Activity, stress, or excitement can bring it on. It often gets better after taking a special medicine called nitroglycerin. This goes under your tongue.  Unstable angina can happen when you are not active or even during sleep. It can suddenly get worse or happen more often. It may not get better after taking the special medicine. It can last up to 30 minutes.  Microvascular angina is more common in women. It may be more severe or last longer than other types.  Prinzmetal angina often happens when you are not active or in the early morning hours. HOME CARE   Only take medicines as told by your doctor.  Stay active or exercise more as told by your doctor.  Limit very hard activity as told by your doctor.  Limit heavy lifting as told by your doctor.  Keep a healthy weight.  Learn about and eat foods that are healthy for your heart.  Do not smoke. GET HELP RIGHT AWAY IF:   You have chest, neck, deep shoulder, or arm pain or discomfort that lasts more than a few minutes.  You have chest, neck, deep shoulder, or arm pain or discomfort that goes away and comes back over and over again.  You have heavy sweating that seems to happen for no reason.  You have shortness of breath or trouble breathing.  Your angina does not get better after a few minutes of rest.  Your angina does not get better  after you take nitroglycerin medicine. These can all be symptoms of a heart attack. Get help right away. Call your local emergency service (911 in [place] Do not  drive yourself to the hospital. Do not  wait to for your symptoms to go away. MAKE SURE YOU:   Understand these instructions.  Will watch your condition.  Will get help right away if you are not doing well or get worse. Document Released: 11/25/2007 Document Revised: 05/25/2012 Document Reviewed: 03/17/2012 [facility] Patient Information 2014 [place], [place]. Atrial Fibrillation Your caregiver has diagnosed you with atrial fibrillation (AFib). The heart normally beats very regularly; AFib is a type of irregular heartbeat. The heart rate may be faster or slower than normal. This can prevent your heart from pumping as well as it should. AFib can be constant (chronic) or intermittent (paroxysmal). CAUSES  Atrial fibrillation may be caused by:  Heart disease, including heart attack, coronary artery disease, heart failure, diseases of the heart valves, and others.  Blood clot in the lungs (pulmonary embolism).  Pneumonia or other infections.  Chronic lung disease.  Thyroid disease.  Toxins. These include alcohol, some medications (such as decongestant medications or diet pills), and caffeine. In some people, no cause for AFib can be found. This is referred to as Lone Atrial Fibrillation. SYMPTOMS   Palpitations or a fluttering in your chest.  A vague sense of chest discomfort.  Shortness of breath.  Sudden onset of lightheadedness or weakness. Sometimes, the first sign of AFib can be a complication of the condition. This could be a stroke or heart failure. DIAGNOSIS  Your description of your condition may make your caregiver suspicious of atrial fibrillation. Your caregiver will examine your pulse to determine if fibrillation is present. An EKG (electrocardiogram) will confirm the diagnosis. Further testing may help determine  what caused you to have atrial fibrillation. This may include chest x-ray, echocardiogram, blood tests, or CT scans. PREVENTION  If you have previously had atrial fibrillation, your caregiver may advise you to avoid substances known to cause the condition (such as stimulant medications, and possibly caffeine or alcohol). You may be advised to use medications to prevent recurrence. Proper treatment of any underlying condition is important to help prevent recurrence. PROGNOSIS  Atrial fibrillation does tend to become a chronic condition over time. It can cause significant complications (see below). Atrial fibrillation is not usually immediately life-threatening, but it can shorten your life expectancy. This seems to be worse in women. If you have lone atrial fibrillation and are under 12 years old, the risk of complications is very low, and life expectancy is not shortened. RISKS AND COMPLICATIONS  Complications of atrial fibrillation can include stroke, chest pain, and heart failure. Your caregiver will recommend treatments for the atrial fibrillation, as well as for any underlying conditions, to help minimize risk of complications. TREATMENT  Treatment for AFib is divided into several categories:  Treatment of any underlying condition.  Converting you out of AFib into a regular (sinus) rhythm.  Controlling rapid heart rate.  Prevention of blood clots and stroke. Medications and procedures are available to convert your atrial fibrillation to sinus rhythm. However, recent studies have shown that this may not offer you any advantage, and cardiac experts are continuing research and debate on this topic. More important is controlling your rapid heartbeat. The rapid heartbeat causes more symptoms, and places strain on your heart. Your caregiver will advise you on the use of medications that can control your heart rate. Atrial fibrillation is a strong stroke risk. You can lessen this risk by taking blood  thinning medications such as Coumadin (warfarin), or sometimes aspirin. These medications need close monitoring by your caregiver. Over-medication can cause bleeding. Too little medication may not protect against stroke. HOME CARE INSTRUCTIONS   If your caregiver prescribed medicine to make your heartbeat more normally, take as directed.  If blood thinners were prescribed by your caregiver, take EXACTLY as directed.  Perform blood tests EXACTLY as directed.  Quit smoking. Smoking increases your cardiac and lung (pulmonary) risks.  DO NOT drink alcohol.  DO NOT drink caffeinated drinks (e.g. coffee, soda, chocolate, and leaf teas). You may drink decaffeinated coffee, soda or tea.  If you are overweight, you should choose a reduced calorie diet to lose weight. Please see a registered dietitian if you need more information about healthy weight loss. DO NOT USE DIET PILLS as they may aggravate heart problems.  If you have other heart problems that are causing AFib, you may need to eat a low salt, fat, and cholesterol diet. Your caregiver will tell you if this is necessary.  Exercise every day to improve your physical fitness. Stay active unless advised otherwise.  If your caregiver has given you a follow-up appointment, it is very important to keep that appointment. Not keeping the appointment could result in heart failure or stroke. If there is any problem keeping the  appointment, you must call back to this facility for assistance. SEEK MEDICAL CARE IF:  You notice a change in the rate, rhythm or strength of your heartbeat.  You develop an infection or any other change in your overall health status. SEEK IMMEDIATE MEDICAL CARE IF:   You develop chest pain, abdominal pain, sweating, weakness or feel sick to your stomach (nausea).  You develop shortness of breath.  You develop swollen feet and ankles.  You develop dizziness, numbness, or weakness of your face or limbs, or any change in  vision or speech. MAKE SURE YOU:   Understand these instructions.  Will watch your condition.  Will get help right away if you are not doing well or get worse. Document Released: 06/08/2005 Document Revised: 08/31/2011 Document Reviewed: 01/15/2010 [facility] Patient Information 2014 [place].

## 2013-01-17 NOTE — Discharge Summary (Signed)
Diane Steele                ACCOUNT NO.:  [id number]  MEDICAL RECORD NO.:  [id number]  LOCATION:  3W14C                        FACILITY:  [facility]  PHYSICIAN:  Pamalee Marcoe N. Terrence Dupont, M.D. DATE OF BIRTH:  [birth year]  DATE OF ADMISSION:  01/13/2013 DATE OF DISCHARGE:  01/16/2013                              DISCHARGE SUMMARY   ADMITTING DIAGNOSES: 1. Recurrent paroxysmal atrial fibrillation with rapid ventricular     response. 2. Unstable angina, rule out myocardial infarction. 3. Hypertension. 4. Uncontrolled diabetes mellitus. 5. Morbid obesity. 6. Hypercholesteremia.  DISCHARGE DIAGNOSES: 1. Status post recurrent atrial fibrillation with rapid ventricular     response, now converted to sinus rhythm. 2. Stable angina, myocardial infarction ruled out, negative nuclear     stress test. 3. Hypertension. 4. Diabetes mellitus. 5. Morbid obesity. 6. Hypercholesteremia.  DISCHARGE HOME MEDICATIONS: 1. Amiodarone 400 mg twice daily which will be tapered down after 1     week to 200 twice daily. 2. Apixaban 5 mg 1 tablet twice daily. 3. Tradjenta 5 mg 1 tablet daily. 4. Metoprolol tartrate 25 mg twice daily. 5. Enteric-coated aspirin 81 mg 1 tablet daily. 6. Atorvastatin 20 mg 1 tablet daily. 7. Glipizide 10 mg twice daily. 8. Metformin 1000 mg twice daily. 9. Multivitamin 1 tablet daily. 10.Nitrostat sublingual use as directed. 11.Benicar 20 mg 1 tablet daily. 12.Potassium 10 mEq 1 tablet daily.  Patient has been advised to stopped atenolol, clopidogrel, and ramipril, ramipril is discontinued because of dry hacking cough.  CONDITION AT DISCHARGE:  Stable.  FOLLOWUP:  With me in 1 week.  The patient has been advised to monitor blood pressure and blood sugar daily and chart.  CONDITION ON DISCHARGE:  Stable.  BRIEF HISTORY AND HOSPITAL COURSE:  Diane Steele is 52 year old female with past medical history significant for coronary artery disease status post CABG in the past,  status post PCI's in the past, hypertension, non- insulin-dependent diabetes mellitus, diabetic nephropathy, hypercholesteremia, morbid obesity.  She came to the ER complaining of retrosternal chest pain described as heaviness off and on radiating to right arm associated with nausea and diaphoresis.  Also complains of palpitation off and on for last few weeks associated with feeling dizzy, tired, and no energy.  The patient was noted to be in AFib with RVR, subsequently spontaneously converted to sinus rhythm after IV Cardizem bolus and then was started on IV amiodarone and thus patient was seen in my office approximately 4 months ago for recurrent palpitation and was noted to be in sinus rhythm and was supposed to get event monitor, but missed her appointment.  The patient denies any history of PND orthopnea, but complains of occasional leg swelling.  The patient states lately her blood sugar has been staying also about 300.  PHYSICAL EXAMINATION:  GENERAL:  She was alert, awake, and oriented x3. VITAL SIGNS:  Blood pressure was 118/81, pulse was 76 after IV Cardizem bolus.  She was afebrile. HEENT:  Conjunctivae was pink. NECK:  Supple.  No JVD.  No bruit. LUNGS:  Clear to auscultation without rhonchi or rales. CARDIOVASCULAR:  S1, S2 was normal.  There was soft systolic murmur.  No S3, gallop. ABDOMEN:  Soft.  Bowel sounds were present.  Nontender. EXTREMITIES:  There is no clubbing, cyanosis, or edema.  There was 1+ edema. NEUROLOGIC:  Grossly intact.  LABORATORY DATA:  Sodium was 131, potassium was 4.3, BUN 20, creatinine 0.84, glucose was 566, hemoglobin was 13.9, hematocrit 39.9, white count of 8.7.  Four sets of cardiac enzymes were negative.  Nuclear stress test showed no evidence of myocardial ischemia or infarction with EF of 64%.  EKG showed AFib with RVR with ST-T wave changes in inferolateral leads.  Repeat EKG showed normal sinus rhythm with T-wave abnormalities in  the lateral leads.  BRIEF HOSPITAL COURSE:  The patient was admitted to telemetry unit.  MI was ruled out by serial enzymes and EKG.  The patient did not have any further episodes of chest pain during the hospital stay.  The patient was also started on IV amiodarone and subsequently was switched to p.o. The patient had 1 further episode of AFib with RVR requiring another bolus of IV amiodarone.  Her p.o. dose has been increased to 400 p.o. b.i.d.  There were no further episodes of AFib with RVR.  IV heparin was switched to p.o. apixaban which she is tolerating well.  The patient also underwent nuclear Lexiscan Myoview stress test which showed no evidence of ischemia or infarction with normal EF.  Her blood sugar has been fairly better controlled now in 180s to 200.  The patient will be discharged home on above medications and will be followed up in my office in 1 week.     Allegra Lai. Terrence Dupont, M.D.     MNH/MEDQ  D:  01/16/2013  T:  01/17/2013  Job:  [id number]

## 2013-01-25 ENCOUNTER — Encounter: Payer: Medicare Other | Attending: Cardiology | Admitting: *Deleted

## 2013-01-25 ENCOUNTER — Encounter: Payer: Self-pay | Admitting: *Deleted

## 2013-01-25 VITALS — Ht 64.5 in | Wt 251.0 lb

## 2013-01-25 DIAGNOSIS — Z713 Dietary counseling and surveillance: Secondary | ICD-10-CM | POA: Insufficient documentation

## 2013-01-25 DIAGNOSIS — E119 Type 2 diabetes mellitus without complications: Secondary | ICD-10-CM

## 2013-01-25 DIAGNOSIS — I200 Unstable angina: Secondary | ICD-10-CM

## 2013-01-25 NOTE — Progress Notes (Addendum)
Medical Nutrition Therapy:  Appt start time: 1500  End time: 1600  Assessment:  Patient was seen on 01/25/13 for individual diabetes education.   DM Self-Mgmt Assessment 01/25/2013  Time since Diabetes Dx 1999  Type of Diabetes Type 2  Previous DM Education? No  Work/school missed in year for illness Disabled  Personal health rating (1 = best) 3  How often is MD seen for diabetes? Every 3 months  Hospitalizations in past year 1  Do you check your glucose? Yes  How often? BID-TID  Do you check your feet? Yes  How often? Daily  Do you exercise? Yes  How often? Walks 30-60 min daily (mild pace d/t heart problems)  Do you take aspirin? Yes  How often? Daily  Do you use alcohol? Occasional  How often? Holidays  Do you use tobacco? No  Eye exam within a year? Yes  Do you follow a meal plan? Yes   Previous pt-reported FBG: 225-230 mg/dl Recent patient-reported 1.5 hr PP: 99-1098 mg/dl (since recent hospital visit) Lab Results  Component Value Date   HGBA1C 13.8* 01/13/2013   MEDICATIONS: Glipizide, Metformin, Tradjenta. See med list for other medications. Reconciled with pt at time of visit   DIETARY INTAKE:  Usual eating pattern includes 3 meals and 2 snacks per day.  24-hr recall:  B (AM): Strawberries (3/4 c), 1 pancake (silver $ size), SF syrup, 2 strips reg bacon; 1 cup coffee w/ Splenda, water Snk (AM): 1/4 c cashews or almonds L (PM): Tuna w/ (reg mayo 2 tbsp) w/ 8 unsalted saltines; water   Snk (PM): NONE D (PM): String bean (cream of chicken LF, low sodium/french onions), 1 c mashed potatoes, beef liver Snk ( PM): NONE Beverages: Diet ginger ale, water, coffee  Usual physical activity: Walks daily at only mild pace d/t recent hospital visit for atrial fibrillation.  Estimated energy needs: 1200-1400 calories (to facilitate weight loss) 135-160g carbohydrates 30 g fat (pt reported MD order) 2000 mg or less sodium  Progress Towards Goal(s):  In  progress.   Nutritional Diagnosis:  [place]-2.1 Impaired nutrient utilization related to glucose metabolism as evidenced by a recent A1c of 13.8% and MD referral for DMSE.    Intervention:  Nutrition counseling provided.  Discussed diabetes disease process and treatment options.  Discussed physiology of diabetes and role of obesity on insulin resistance.  Encouraged moderate weight reduction to improve glucose levels.  Discussed role of medications and diet in glucose control  Provided education on macronutrients on glucose levels.  Provided education on carb counting, importance of regularly scheduled meals/snacks, and meal planning  Discussed effects of physical activity on glucose levels and long-term glucose control.  Recommended 150 minutes of physical activity/week.  Reviewed patient medications.  Discussed role of medication on blood glucose and possible side effects  Discussed blood glucose monitoring and interpretation.  Discussed recommended target ranges and individual ranges.    Described short-term complications: hyper- and hypo-glycemia.  Discussed causes, symptoms, and treatment options.  Not discussed d/t time constraints. Will address at follow-up:  Discussed prevention, detection, and treatment of long-term complications.  Discussed the role of prolonged elevated glucose levels on body systems.  Discussed role of stress on blood glucose levels and discussed strategies to manage psychosocial issues.  Discussed recommendations for long-term diabetes self-care.  Established checklist for medical, dental, and emotional self-care.  Handouts given during visit include:  Living Well with Diabetes book ([employer])  Low CHO Snack List  30g CHO Meal Plan  Target Blood Glucose Levels  Barriers to learning/adherance to lifestyle change: None  Diabetes self-care support plan:  [facility] Diabetes Support Group  Return visit in 4 weeks  Monitoring/Evaluation:  Dietary intake,  exercise, A1c, blood glucose trends, and body weight in 4 week(s).

## 2013-01-25 NOTE — Patient Instructions (Addendum)
Goals:  Follow Diabetes Meal Plan as instructed  Eat 3 meals and 2-3 snacks, every 3-5 hrs  Limit sodium to 2000 mg or less daily.   Avoid fried/high fat foods  Limit carbohydrate intake to 30 grams carbohydrate/meal  Limit carbohydrate intake to 15 grams carbohydrate/snack  Add lean protein foods to meals/snacks  Monitor glucose levels as instructed by your doctor  Aim for 30 mins of physical activity daily  Bring food record and glucose log to your next nutrition visit

## 2013-02-21 ENCOUNTER — Ambulatory Visit (HOSPITAL_COMMUNITY)
Admission: RE | Admit: 2013-02-21 | Discharge: 2013-02-21 | Disposition: A | Discharge status: Home / Self Care | Payer: Medicare Other | Source: Ambulatory Visit | Attending: *Deleted | Admitting: *Deleted

## 2013-02-21 DIAGNOSIS — Z1231 Encounter for screening mammogram for malignant neoplasm of breast: Secondary | ICD-10-CM | POA: Insufficient documentation

## 2013-02-22 ENCOUNTER — Ambulatory Visit: Payer: Medicare Other | Admitting: Dietician

## 2013-03-01 ENCOUNTER — Ambulatory Visit: Payer: Medicare Other | Admitting: *Deleted

## 2013-04-27 ENCOUNTER — Other Ambulatory Visit: Payer: Self-pay

## 2014-03-21 ENCOUNTER — Other Ambulatory Visit (HOSPITAL_COMMUNITY): Payer: Self-pay | Admitting: *Deleted

## 2014-03-21 DIAGNOSIS — Z1231 Encounter for screening mammogram for malignant neoplasm of breast: Secondary | ICD-10-CM

## 2014-03-27 ENCOUNTER — Ambulatory Visit (HOSPITAL_COMMUNITY): Payer: Medicare Other | Attending: *Deleted

## 2014-07-26 IMAGING — CR DG CHEST 1V PORT
1 series · 1 of 1 positions shown · non-contrast
Comparison: 06/01/2007.

CLINICAL DATA: Tachycardia.  Palpitations.  Chest pain.

PORTABLE CHEST - 1 VIEW

[AP]
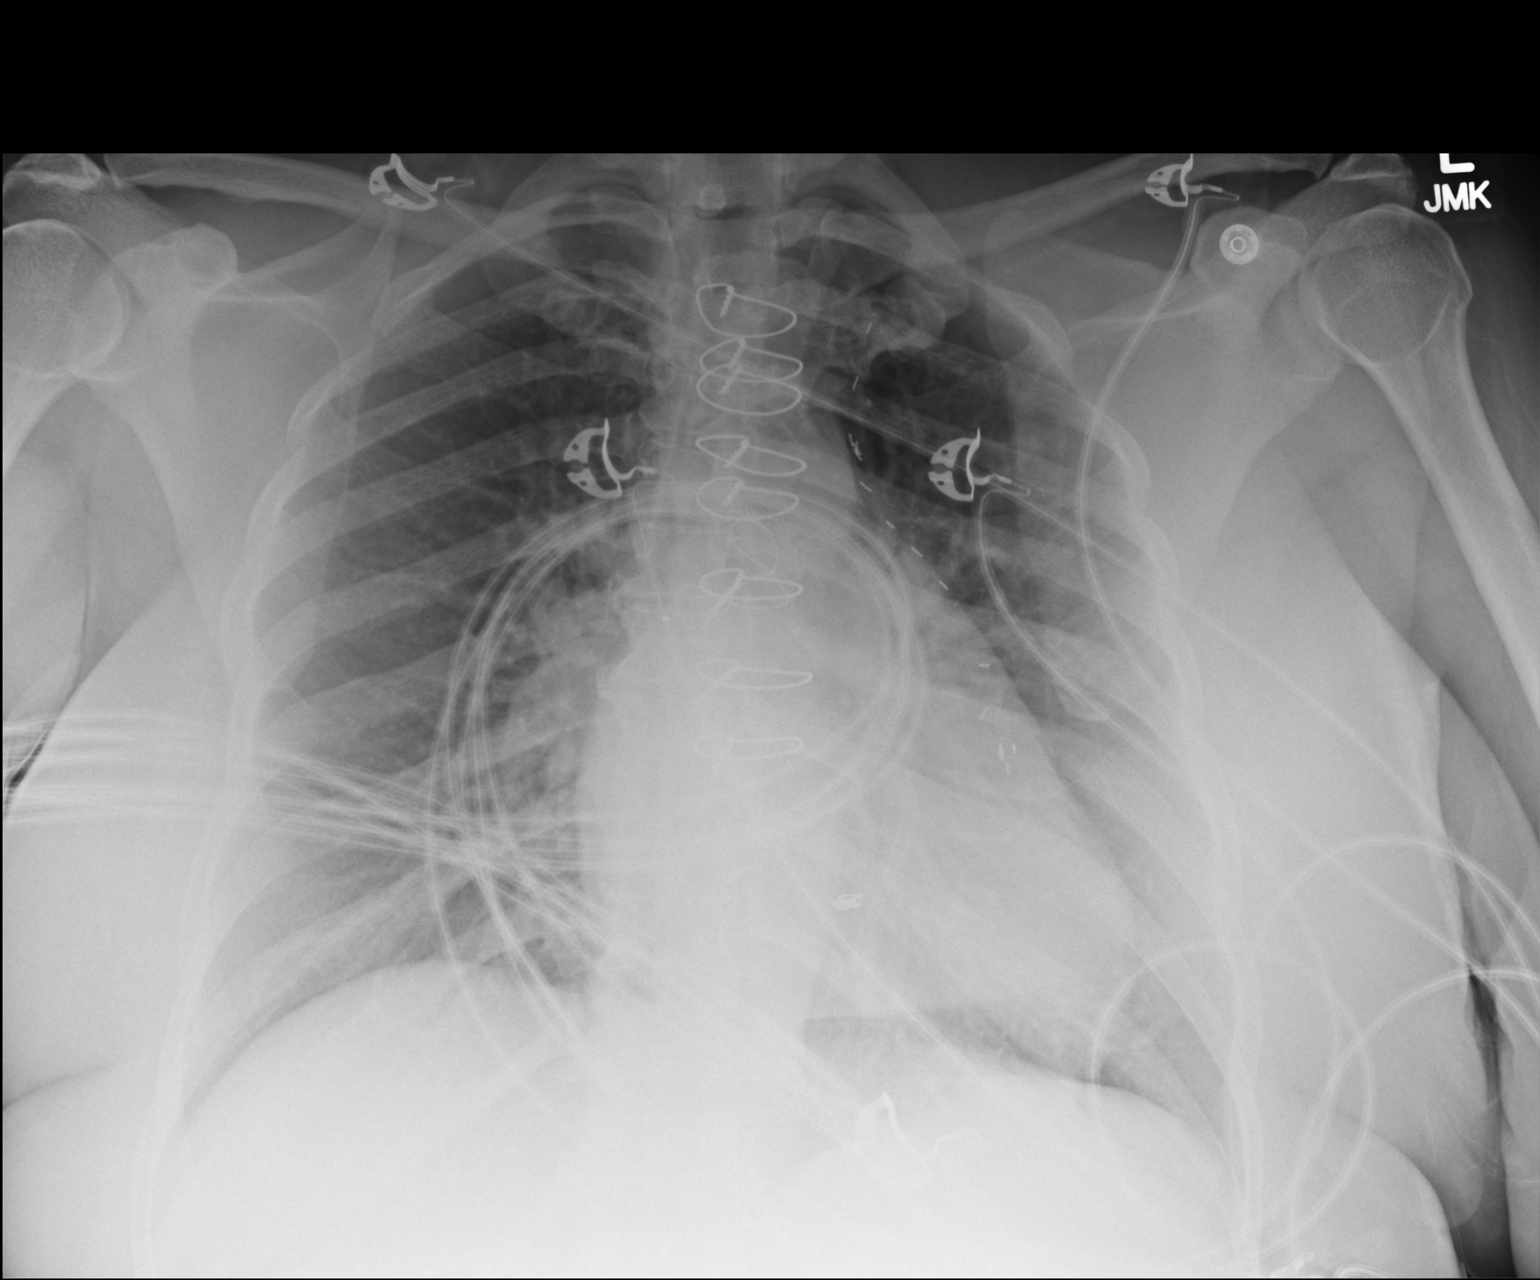

[1 of 1 positions shown; findings below may reference images not displayed]

FINDINGS: Cardiopericardial silhouette within normal limits.
Mediastinal contours normal. Trachea midline.  No airspace disease
or effusion. Monitoring leads are projected over the chest.Mild
apical lordotic projection.CABG/median sternotomy.
IMPRESSION: No active cardiopulmonary disease.  Postoperative changes CABG.

## 2015-07-26 ENCOUNTER — Other Ambulatory Visit: Payer: Self-pay

## 2015-07-26 DIAGNOSIS — Z1231 Encounter for screening mammogram for malignant neoplasm of breast: Secondary | ICD-10-CM

## 2015-08-12 ENCOUNTER — Ambulatory Visit
Admission: RE | Admit: 2015-08-12 | Discharge: 2015-08-12 | Disposition: A | Discharge status: Home / Self Care | Payer: Medicare Other | Source: Ambulatory Visit

## 2015-08-12 DIAGNOSIS — Z1231 Encounter for screening mammogram for malignant neoplasm of breast: Secondary | ICD-10-CM

## 2016-04-17 ENCOUNTER — Other Ambulatory Visit: Payer: Self-pay | Admitting: Cardiology

## 2016-04-17 DIAGNOSIS — R079 Chest pain, unspecified: Secondary | ICD-10-CM

## 2016-04-22 ENCOUNTER — Encounter (HOSPITAL_COMMUNITY): Payer: Medicare Other

## 2016-04-29 ENCOUNTER — Encounter (HOSPITAL_COMMUNITY): Payer: Medicare Other

## 2016-04-29 ENCOUNTER — Other Ambulatory Visit (HOSPITAL_COMMUNITY): Payer: Medicare Other

## 2016-05-08 ENCOUNTER — Encounter (HOSPITAL_COMMUNITY)
Admission: RE | Admit: 2016-05-08 | Discharge: 2016-05-08 | Disposition: A | Discharge status: Home / Self Care | Payer: Medicare Other | Source: Ambulatory Visit | Attending: Cardiology | Admitting: Cardiology

## 2016-05-08 DIAGNOSIS — R079 Chest pain, unspecified: Secondary | ICD-10-CM

## 2016-05-08 MED ORDER — REGADENOSON 0.4 MG/5ML IV SOLN
0.4000 mg | Freq: Once | INTRAVENOUS | Status: AC
  Administered 2016-05-08: 0.4 mg via INTRAVENOUS

## 2016-05-08 MED ORDER — TECHNETIUM TC 99M TETROFOSMIN IV KIT
30.0000 | PACK | Freq: Once | INTRAVENOUS | Status: AC | PRN
  Administered 2016-05-08: 30 via INTRAVENOUS

## 2016-05-08 MED ORDER — TECHNETIUM TC 99M TETROFOSMIN IV KIT
10.0000 | PACK | Freq: Once | INTRAVENOUS | Status: AC | PRN
  Administered 2016-05-08: 10 via INTRAVENOUS

## 2016-05-08 MED ORDER — REGADENOSON 0.4 MG/5ML IV SOLN
INTRAVENOUS | Status: AC
  Filled 2016-05-08: qty 5

## 2016-07-19 ENCOUNTER — Emergency Department (HOSPITAL_COMMUNITY)
Admission: EM | Admit: 2016-07-19 | Discharge: 2016-07-19 | Disposition: A | Discharge status: Home / Self Care | Payer: Medicare Other | Attending: Emergency Medicine | Admitting: Emergency Medicine

## 2016-07-19 ENCOUNTER — Encounter (HOSPITAL_COMMUNITY): Payer: Self-pay | Admitting: Emergency Medicine

## 2016-07-19 ENCOUNTER — Emergency Department (HOSPITAL_COMMUNITY): Payer: Medicare Other

## 2016-07-19 DIAGNOSIS — I509 Heart failure, unspecified: Secondary | ICD-10-CM | POA: Diagnosis not present

## 2016-07-19 DIAGNOSIS — Z87891 Personal history of nicotine dependence: Secondary | ICD-10-CM | POA: Insufficient documentation

## 2016-07-19 DIAGNOSIS — Z7982 Long term (current) use of aspirin: Secondary | ICD-10-CM | POA: Insufficient documentation

## 2016-07-19 DIAGNOSIS — Z7984 Long term (current) use of oral hypoglycemic drugs: Secondary | ICD-10-CM | POA: Insufficient documentation

## 2016-07-19 DIAGNOSIS — I2510 Atherosclerotic heart disease of native coronary artery without angina pectoris: Secondary | ICD-10-CM | POA: Diagnosis not present

## 2016-07-19 DIAGNOSIS — Z955 Presence of coronary angioplasty implant and graft: Secondary | ICD-10-CM | POA: Insufficient documentation

## 2016-07-19 DIAGNOSIS — E119 Type 2 diabetes mellitus without complications: Secondary | ICD-10-CM | POA: Insufficient documentation

## 2016-07-19 DIAGNOSIS — R079 Chest pain, unspecified: Secondary | ICD-10-CM | POA: Diagnosis present

## 2016-07-19 DIAGNOSIS — I110 Hypertensive heart disease with heart failure: Secondary | ICD-10-CM | POA: Diagnosis not present

## 2016-07-19 DIAGNOSIS — Z951 Presence of aortocoronary bypass graft: Secondary | ICD-10-CM | POA: Diagnosis not present

## 2016-07-19 LAB — BRAIN NATRIURETIC PEPTIDE: B Natriuretic Peptide: 118.4 pg/mL — ABNORMAL HIGH (ref 0.0–100.0)

## 2016-07-19 LAB — CBC
HCT: 30.6 % — ABNORMAL LOW (ref 36.0–46.0)
Hemoglobin: 9.5 g/dL — ABNORMAL LOW (ref 12.0–15.0)
MCH: 26.2 pg (ref 26.0–34.0)
MCHC: 31 g/dL (ref 30.0–36.0)
MCV: 84.5 fL (ref 78.0–100.0)
PLATELETS: 202 10*3/uL (ref 150–400)
RBC: 3.62 MIL/uL — ABNORMAL LOW (ref 3.87–5.11)
RDW: 15.8 % — AB (ref 11.5–15.5)
WBC: 5.2 10*3/uL (ref 4.0–10.5)

## 2016-07-19 LAB — BASIC METABOLIC PANEL
ANION GAP: 9 (ref 5–15)
BUN: 22 mg/dL — ABNORMAL HIGH (ref 6–20)
CALCIUM: 9.1 mg/dL (ref 8.9–10.3)
CO2: 21 mmol/L — ABNORMAL LOW (ref 22–32)
CREATININE: 1.17 mg/dL — AB (ref 0.44–1.00)
Chloride: 108 mmol/L (ref 101–111)
GFR calc Af Amer: 60 mL/min — ABNORMAL LOW (ref >60)
GFR, EST NON AFRICAN AMERICAN: 51 mL/min — AB (ref >60)
GLUCOSE: 155 mg/dL — AB (ref 65–99)
Potassium: 4.2 mmol/L (ref 3.5–5.1)
Sodium: 138 mmol/L (ref 135–145)

## 2016-07-19 LAB — I-STAT TROPONIN, ED
Troponin i, poc: 0 ng/mL (ref 0.00–0.08)
Troponin i, poc: 0 ng/mL (ref 0.00–0.08)

## 2016-07-19 LAB — TROPONIN I: Troponin I: <0.03 ng/mL (ref <0.03)

## 2016-07-19 MED ORDER — MORPHINE SULFATE (PF) 4 MG/ML IV SOLN
4.0000 mg | Freq: Once | INTRAVENOUS | Status: AC
  Administered 2016-07-19: 4 mg via INTRAVENOUS
  Filled 2016-07-19: qty 1

## 2016-07-19 NOTE — ED Provider Notes (Signed)
[facility] DEPT Provider Note   CSN: [id number] Arrival date & time: 07/19/16  1109     History   Chief Complaint Chief Complaint  Patient presents with  . Chest Pain    HPI Diane Steele is a 56 y.o. female.  HPI Patient presents to the emergency department with complaints of constant left-sided achy chest pain that woke her up from sleep and is been constant since then.  She reports cough without congestion.  No shortness of breath.  No fevers or chills.  Pain is mild in severity.  She has a history of congestive heart failure as well as coronary artery disease.  Her history DVT or pulmonary embolism.  She's had a prior coronary artery bypass graft.  No recent illness   Past Medical History:  Diagnosis Date  . Anginal pain ([place])   . Anxiety   . Arthritis   . CHF (congestive heart failure) ([place])   . Coronary artery disease   . Depression   . Diabetes mellitus without complication ([place])   . GERD (gastroesophageal reflux disease)   . High cholesterol   . Hypertension   . Myocardial infarction    " MILD "  IN 1997  . Neuromuscular disorder ([place])    DIABETIC NEUROPATHY  . Shortness of breath     There are no active problems to display for this patient.   Past Surgical History:  Procedure Laterality Date  . CHOLECYSTECTOMY    . CORONARY ARTERY BYPASS GRAFT    . CORONARY STENT PLACEMENT    . CYST EXCISION     CHEST & WRIST    OB History    No data available       Home Medications    Prior to Admission medications   Medication Sig Start Date End Date Taking? Authorizing Provider  amiodarone (PACERONE) 200 MG tablet Take 200 mg by mouth daily.   Yes Historical Provider, MD  apixaban (ELIQUIS) 5 MG TABS tablet Take 1 tablet (5 mg total) by mouth 2 (two) times daily. Patient taking differently: Take 2.5 mg by mouth 2 (two) times daily.  01/16/13  Yes Charolette Forward, MD  aspirin EC 81 MG tablet Take 81 mg by mouth daily.   Yes Historical Provider, MD    atorvastatin (LIPITOR) 20 MG tablet Take 20 mg by mouth daily.   Yes Historical Provider, MD  furosemide (LASIX) 20 MG tablet Take 40 mg by mouth daily.    Yes Charolette Forward, MD  glipiZIDE (GLUCOTROL XL) 10 MG 24 hr tablet Take 5 mg by mouth daily with breakfast.   Yes Historical Provider, MD  isosorbide mononitrate (IMDUR) 60 MG 24 hr tablet Take 60 mg by mouth daily.   Yes Historical Provider, MD  linagliptin (TRADJENTA) 5 MG TABS tablet Take 1 tablet (5 mg total) by mouth daily. 01/16/13  Yes Charolette Forward, MD  metFORMIN (GLUCOPHAGE) 500 MG tablet Take 1,000 mg by mouth 2 (two) times daily with a meal.   Yes Historical Provider, MD  metoprolol tartrate (LOPRESSOR) 25 MG tablet Take 1 tablet (25 mg total) by mouth 2 (two) times daily. Patient taking differently: Take 12.5 mg by mouth 2 (two) times daily.  01/16/13  Yes Charolette Forward, MD  Multiple Vitamin (MULTI-VITAMIN DAILY PO) Take 1 tablet by mouth daily.   Yes Historical Provider, MD  olmesartan (BENICAR) 20 MG tablet Take 20 mg by mouth daily.   Yes Historical Provider, MD  potassium chloride (K-DUR) 10 MEQ tablet Take 10  mEq by mouth daily.   Yes Historical Provider, MD  amiodarone (PACERONE) 400 MG tablet Take 1 tablet (400 mg total) by mouth 2 (two) times daily. Patient not taking: Reported on 07/19/2016 01/16/13   Charolette Forward, MD  glipiZIDE (GLUCOTROL) 10 MG tablet Take 10 mg by mouth 2 (two) times daily before a meal.    Historical Provider, MD  nitroGLYCERIN (NITROSTAT) 0.4 MG SL tablet Place 0.4 mg under the tongue every 5 (five) minutes as needed for chest pain.    Historical Provider, MD    Family History No family history on file.  Social History Social History  Substance Use Topics  . Smoking status: Former Smoker    Types: Cigarettes    Start date: 01/25/2002  . Smokeless tobacco: Never Used  . Alcohol use Yes     Comment: SOCIAL     Allergies   Patient has no known allergies.   Review of Systems Review of  Systems  All other systems reviewed and are negative.    Physical Exam Updated Vital Signs BP (!) 196/113   Pulse 71   Temp 98 F (36.7 C) (Oral)   Resp 18   Ht 5\' 5"  (1.651 m)   Wt 270 lb (122.5 kg)   SpO2 100%   BMI 44.93 kg/m   Physical Exam  Constitutional: She is oriented to person, place, and time. She appears well-developed and well-nourished. No distress.  HENT:  Head: Normocephalic and atraumatic.  Eyes: EOM are normal.  Neck: Normal range of motion.  Cardiovascular: Normal rate, regular rhythm and normal heart sounds.   Pulmonary/Chest: Effort normal and breath sounds normal.  Abdominal: Soft. She exhibits no distension. There is no tenderness.  Musculoskeletal: Normal range of motion.  Neurological: She is alert and oriented to person, place, and time.  Skin: Skin is warm and dry.  Psychiatric: She has a normal mood and affect. Judgment normal.  Nursing note and vitals reviewed.    ED Treatments / Results  Labs (all labs ordered are listed, but only abnormal results are displayed) Labs Reviewed  CBC - Abnormal; Notable for the following:       Result Value   RBC 3.62 (*)    Hemoglobin 9.5 (*)    HCT 30.6 (*)    RDW 15.8 (*)    All other components within normal limits  BASIC METABOLIC PANEL - Abnormal; Notable for the following:    CO2 21 (*)    Glucose, Bld 155 (*)    BUN 22 (*)    Creatinine, Ser 1.17 (*)    GFR calc non Af Amer 51 (*)    GFR calc Af Amer 60 (*)    All other components within normal limits  BRAIN NATRIURETIC PEPTIDE - Abnormal; Notable for the following:    B Natriuretic Peptide 118.4 (*)    All other components within normal limits  TROPONIN I  I-STAT TROPOININ, ED  I-STAT TROPOININ, ED  I-STAT TROPOININ, ED    EKG  EKG Interpretation  Date/Time:  Sunday July 19 2016 11:16:42 EST Ventricular Rate:  72 PR Interval:  148 QRS Duration: 82 QT Interval:  420 QTC Calculation: 459 R Axis:   88 Text Interpretation:   Normal sinus rhythm ST & T wave abnormality, consider lateral ischemia Abnormal ECG no longer in afib compared to ecg in July 2014 Confirmed by Venora Maples  MD, Lennette Bihari ([id number]) on 07/19/2016 11:27:31 AM       Radiology Dg Chest 2 View  Result Date: 07/19/2016 CLINICAL DATA:  Midsternal chest pain. EXAM: CHEST  2 VIEW COMPARISON:  01/13/2013 FINDINGS: Postsurgical changes from CABG is stable. Mild enlargement of the cardiac silhouette. Mediastinal contours appear intact. There is no evidence of focal airspace consolidation, pleural effusion or pneumothorax. Osseous structures are without acute abnormality. Soft tissues are grossly normal. IMPRESSION: Mild enlargement of the cardiac silhouette without evidence of pulmonary edema. Electronically Signed   By: Fidela Salisbury M.D.   On: 07/19/2016 12:08    Procedures Procedures (including critical care time)  Medications Ordered in ED Medications  morphine 4 MG/ML injection 4 mg (4 mg Intravenous Given 07/19/16 1339)     Initial Impression / Assessment and Plan / ED Course  I have reviewed the triage vital signs and the nursing notes.  Pertinent labs & imaging results that were available during my care of the patient were reviewed by me and considered in my medical decision making (see chart for details).     Patient is overall well-appearing.  Nonspecific left chest pain.  EKG without ischemic changes.  Troponin negative 2.  Outpatient primary care and cardiology follow-up.  She understands return to the ER for new or worsening symptoms  Final Clinical Impressions(s) / ED Diagnoses   Final diagnoses:  Chest pain, unspecified type    New Prescriptions New Prescriptions   No medications on file     Jola Schmidt, MD 07/19/16 1655

## 2016-07-19 NOTE — ED Triage Notes (Signed)
Pt reports aching left sided chest pain that woke her from sleep, states that pain is aching. Reports dry cough, denies fever/chills.

## 2016-09-03 DIAGNOSIS — E039 Hypothyroidism, unspecified: Secondary | ICD-10-CM | POA: Insufficient documentation

## 2016-11-02 ENCOUNTER — Other Ambulatory Visit: Payer: Self-pay | Admitting: *Deleted

## 2016-11-02 DIAGNOSIS — Z1231 Encounter for screening mammogram for malignant neoplasm of breast: Secondary | ICD-10-CM

## 2016-11-24 ENCOUNTER — Ambulatory Visit
Admission: RE | Admit: 2016-11-24 | Discharge: 2016-11-24 | Disposition: A | Discharge status: Home / Self Care | Payer: Medicare Other | Source: Ambulatory Visit | Attending: *Deleted | Admitting: *Deleted

## 2016-11-24 DIAGNOSIS — Z1231 Encounter for screening mammogram for malignant neoplasm of breast: Secondary | ICD-10-CM

## 2018-01-29 IMAGING — DX DG CHEST 2V
2 series · 2 of 2 positions shown · non-contrast
Comparison: 01/13/2013

CLINICAL DATA: Midsternal chest pain.

EXAM:
CHEST  2 VIEW

[w chest pa]
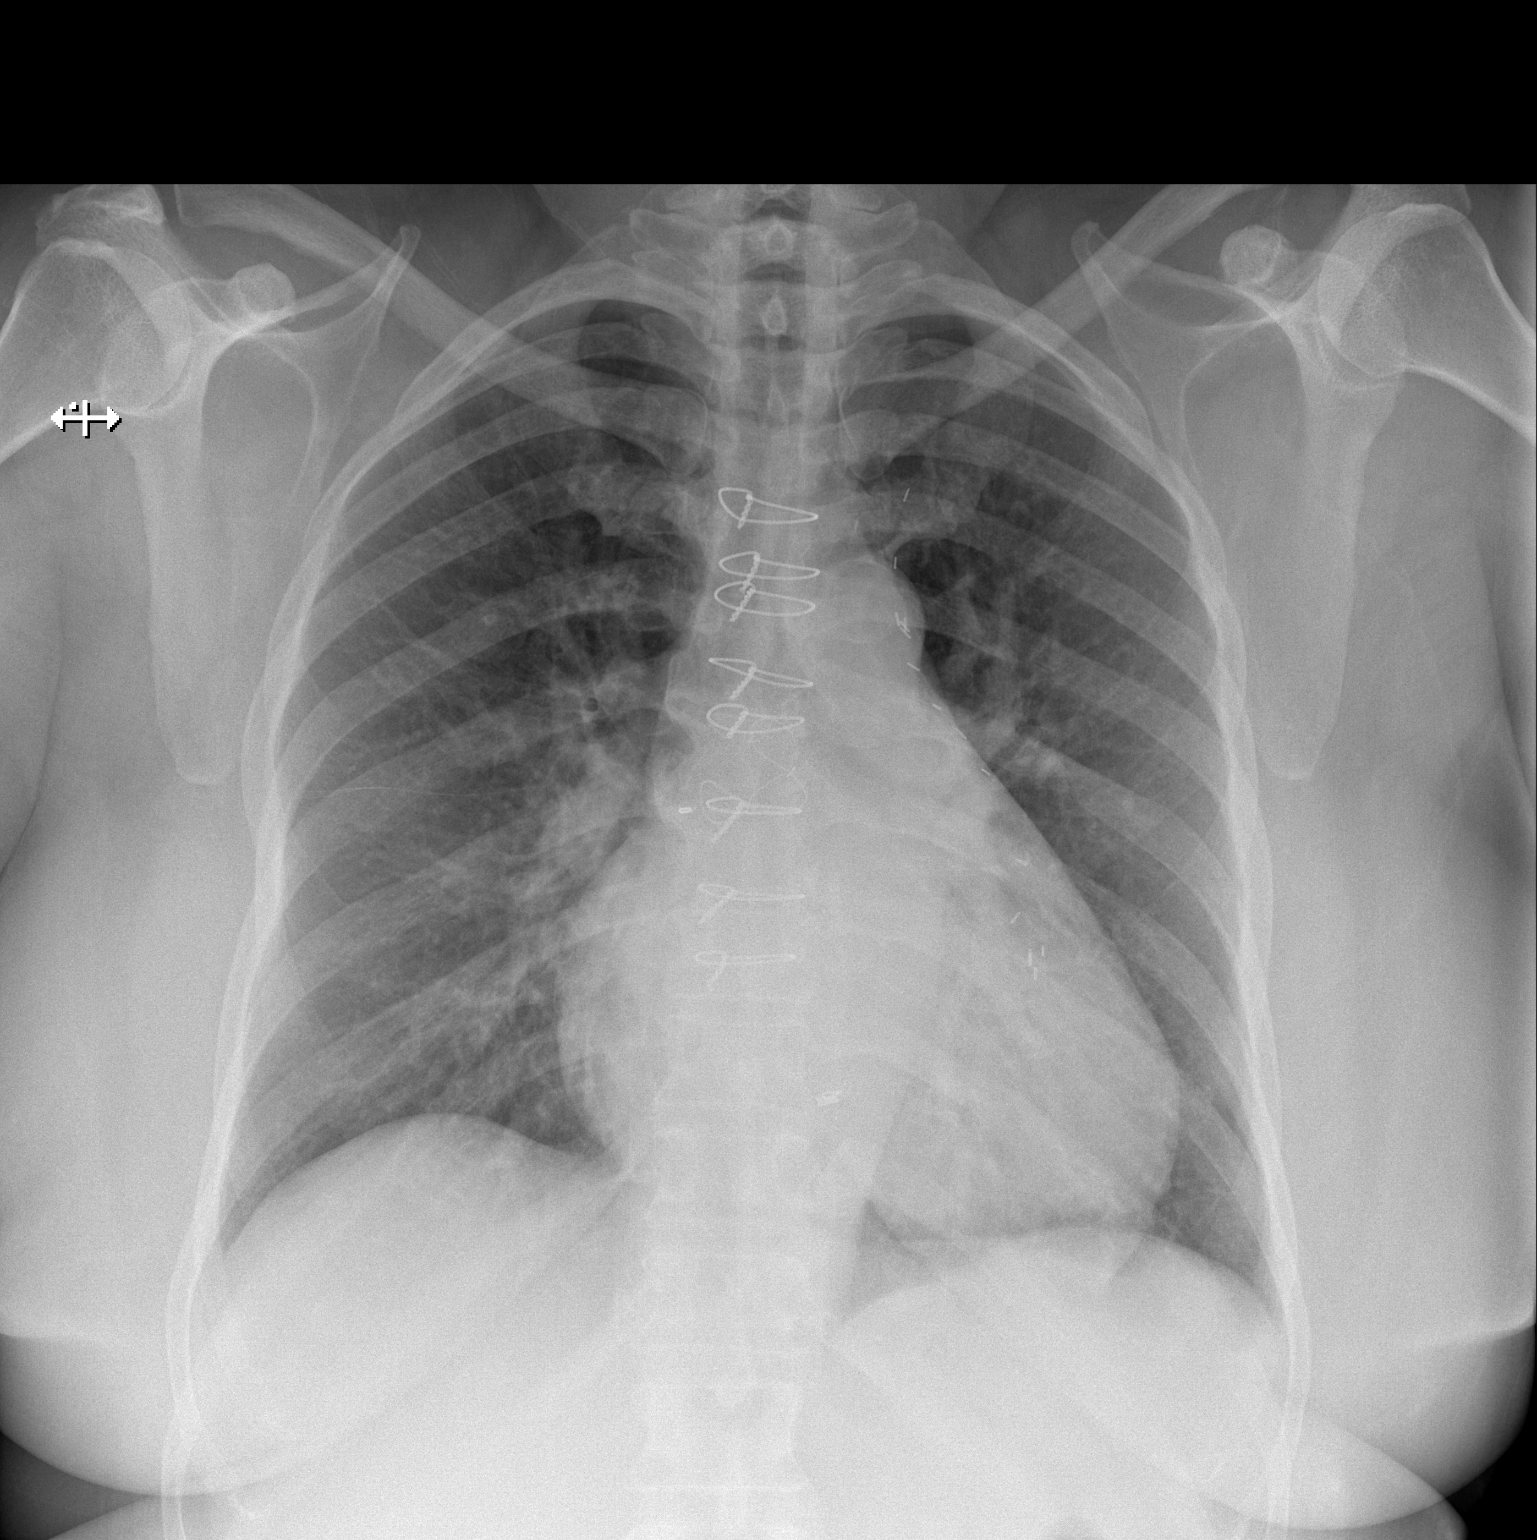

[w chest lat]
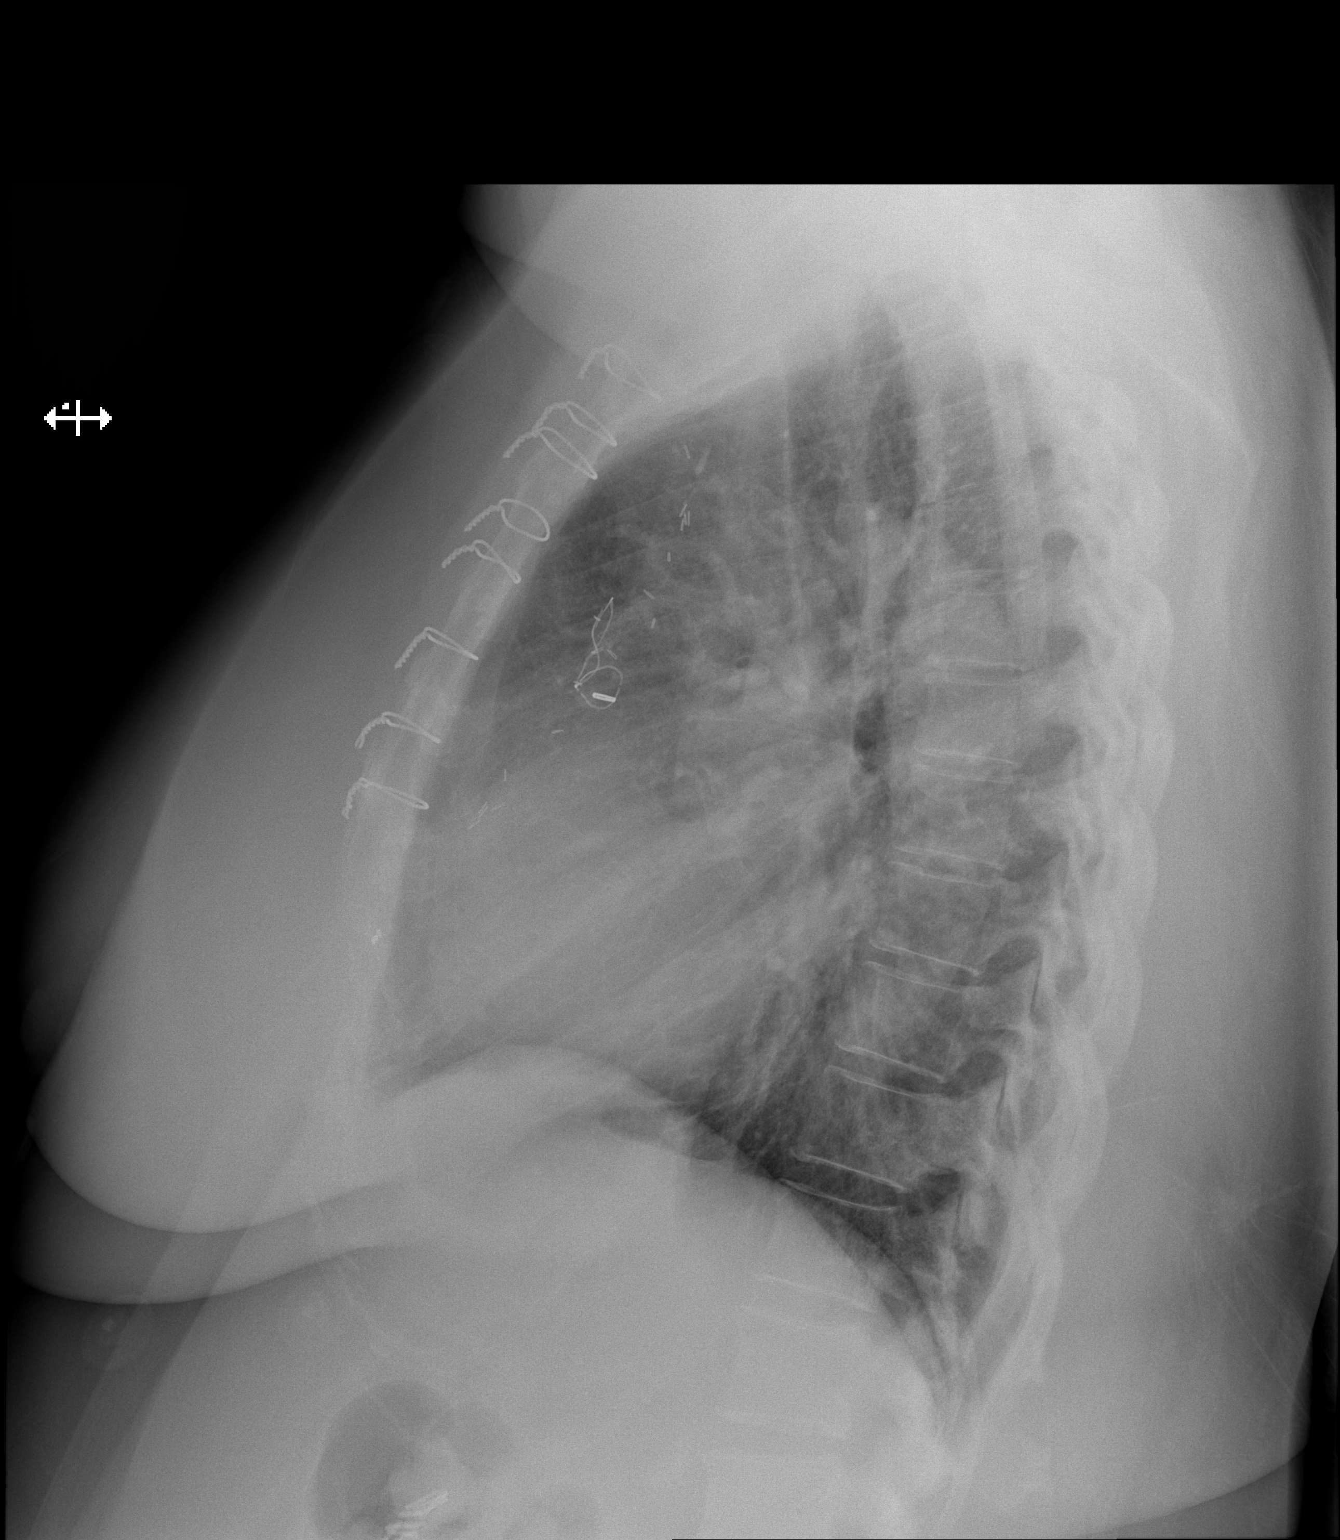

[2 of 2 positions shown; findings below may reference images not displayed]

FINDINGS: Postsurgical changes from CABG is stable.

Mild enlargement of the cardiac silhouette. Mediastinal contours
appear intact.

There is no evidence of focal airspace consolidation, pleural
effusion or pneumothorax.

Osseous structures are without acute abnormality. Soft tissues are
grossly normal.
IMPRESSION: Mild enlargement of the cardiac silhouette without evidence of
pulmonary edema.

## 2018-08-02 ENCOUNTER — Other Ambulatory Visit: Payer: Self-pay | Admitting: Internal Medicine

## 2018-08-02 DIAGNOSIS — Z1231 Encounter for screening mammogram for malignant neoplasm of breast: Secondary | ICD-10-CM

## 2018-08-03 ENCOUNTER — Telehealth: Payer: Self-pay

## 2018-08-03 NOTE — Telephone Encounter (Signed)
NOTES ON FILE 

## 2018-08-05 ENCOUNTER — Other Ambulatory Visit (HOSPITAL_COMMUNITY): Payer: Self-pay | Admitting: Internal Medicine

## 2018-08-05 ENCOUNTER — Other Ambulatory Visit: Payer: Self-pay | Admitting: Internal Medicine

## 2018-08-05 DIAGNOSIS — R0601 Orthopnea: Secondary | ICD-10-CM

## 2018-08-12 ENCOUNTER — Ambulatory Visit (HOSPITAL_COMMUNITY): Payer: Medicare Other | Attending: Cardiovascular Disease

## 2018-08-12 DIAGNOSIS — R0601 Orthopnea: Secondary | ICD-10-CM

## 2018-08-12 MED ORDER — PERFLUTREN LIPID MICROSPHERE
1.0000 mL | INTRAVENOUS | Status: AC | PRN
  Administered 2018-08-12: 2 mL via INTRAVENOUS

## 2018-09-01 ENCOUNTER — Ambulatory Visit: Payer: Medicare Other

## 2018-09-09 ENCOUNTER — Other Ambulatory Visit: Payer: Self-pay | Admitting: Family Medicine

## 2018-09-09 ENCOUNTER — Ambulatory Visit
Admission: RE | Admit: 2018-09-09 | Discharge: 2018-09-09 | Disposition: A | Discharge status: Home / Self Care | Payer: Medicare Other | Source: Ambulatory Visit | Attending: Family | Admitting: Family

## 2018-09-09 DIAGNOSIS — R109 Unspecified abdominal pain: Secondary | ICD-10-CM

## 2019-08-11 ENCOUNTER — Ambulatory Visit
Admission: RE | Admit: 2019-08-11 | Discharge: 2019-08-11 | Disposition: A | Discharge status: Home / Self Care | Payer: Medicare Other | Source: Ambulatory Visit | Attending: Internal Medicine | Admitting: Internal Medicine

## 2019-08-11 ENCOUNTER — Other Ambulatory Visit: Payer: Self-pay

## 2019-08-11 DIAGNOSIS — Z1231 Encounter for screening mammogram for malignant neoplasm of breast: Secondary | ICD-10-CM

## 2020-01-19 ENCOUNTER — Other Ambulatory Visit: Payer: Self-pay | Admitting: Hematology and Oncology

## 2020-01-19 ENCOUNTER — Other Ambulatory Visit: Payer: Self-pay | Admitting: Physician Assistant

## 2020-01-19 DIAGNOSIS — K219 Gastro-esophageal reflux disease without esophagitis: Secondary | ICD-10-CM

## 2020-01-22 LAB — HEMOGLOBIN A1C: Hemoglobin A1C: 10.4

## 2020-01-22 LAB — BASIC METABOLIC PANEL
BUN: 48 — AB (ref 4–21)
CO2: 22 (ref 13–22)
Chloride: 108 (ref 99–108)
Creatinine: 2.2 — AB (ref 0.5–1.1)
Glucose: 360
Potassium: 5.3 (ref 3.4–5.3)
Sodium: 135 — AB (ref 137–147)

## 2020-01-22 LAB — COMPREHENSIVE METABOLIC PANEL
Albumin: 4.1 (ref 3.5–5.0)
Calcium: 9.6 (ref 8.7–10.7)
GFR calc Af Amer: 28
GFR calc non Af Amer: 24
Globulin: 3

## 2020-01-22 LAB — HEPATIC FUNCTION PANEL
ALT: 32 (ref 7–35)
AST: 16 (ref 13–35)
Alkaline Phosphatase: 111 (ref 25–125)
Bilirubin, Total: 0.7

## 2020-01-26 ENCOUNTER — Ambulatory Visit
Admission: RE | Admit: 2020-01-26 | Discharge: 2020-01-26 | Disposition: A | Discharge status: Home / Self Care | Payer: Medicare Other | Source: Ambulatory Visit | Attending: Physician Assistant | Admitting: Physician Assistant

## 2020-01-26 DIAGNOSIS — K219 Gastro-esophageal reflux disease without esophagitis: Secondary | ICD-10-CM

## 2020-02-09 ENCOUNTER — Encounter: Payer: Self-pay | Admitting: Endocrinology

## 2020-04-05 ENCOUNTER — Encounter: Payer: Self-pay | Admitting: Endocrinology

## 2020-04-05 ENCOUNTER — Other Ambulatory Visit: Payer: Self-pay

## 2020-04-05 ENCOUNTER — Ambulatory Visit: Payer: Medicare Other | Admitting: Endocrinology

## 2020-04-05 DIAGNOSIS — I4891 Unspecified atrial fibrillation: Secondary | ICD-10-CM | POA: Insufficient documentation

## 2020-04-05 DIAGNOSIS — E1122 Type 2 diabetes mellitus with diabetic chronic kidney disease: Secondary | ICD-10-CM

## 2020-04-05 DIAGNOSIS — I519 Heart disease, unspecified: Secondary | ICD-10-CM | POA: Insufficient documentation

## 2020-04-05 DIAGNOSIS — J209 Acute bronchitis, unspecified: Secondary | ICD-10-CM | POA: Insufficient documentation

## 2020-04-05 DIAGNOSIS — J0140 Acute pansinusitis, unspecified: Secondary | ICD-10-CM | POA: Insufficient documentation

## 2020-04-05 DIAGNOSIS — N184 Chronic kidney disease, stage 4 (severe): Secondary | ICD-10-CM | POA: Diagnosis not present

## 2020-04-05 DIAGNOSIS — I10 Essential (primary) hypertension: Secondary | ICD-10-CM | POA: Insufficient documentation

## 2020-04-05 DIAGNOSIS — E119 Type 2 diabetes mellitus without complications: Secondary | ICD-10-CM | POA: Insufficient documentation

## 2020-04-05 LAB — POCT GLYCOSYLATED HEMOGLOBIN (HGB A1C): Hemoglobin A1C: 9.2 % — AB (ref 4.0–5.6)

## 2020-04-05 MED ORDER — RYBELSUS 7 MG PO TABS: 7.0000 mg | ORAL_TABLET | Freq: Every day | ORAL | 3 refills | Status: DC

## 2020-04-05 MED ORDER — GLIPIZIDE 10 MG PO TABS
10.0000 mg | ORAL_TABLET | Freq: Every day | ORAL | 11 refills | Status: DC
Start: 2020-04-05 — End: 2021-01-06

## 2020-04-05 NOTE — Patient Instructions (Addendum)
good diet and exercise significantly improve the control of your diabetes.  please let me know if you wish to be referred to a dietician.  high blood sugar is very risky to your health.  you should see an eye doctor and dentist every year.  It is very important to get all recommended vaccinations.  Controlling your blood pressure and cholesterol drastically reduces the damage diabetes does to your body.  Those who smoke should quit.  Please discuss these with your doctor.  check your blood sugar 4 times a day.  vary the time of day when you check, between before the 3 meals, and at bedtime.  also check if you have symptoms of your blood sugar being too high or too low.  please keep a record of the readings and bring it to your next appointment here (or you can bring the meter itself).  You can write it on any piece of paper.  please call us sooner if your blood sugar goes below 70, or if you have a lot of readings over 200. We will need to take this complex situation in stages.   For now, please: Please reduce the glipizide to breakfast only, and: Change the tradjenta to "Rybelsus" (to avoid nausea, take 1/2 pill the first week).   Please come back for a follow-up appointment in 6 weeks.

## 2020-04-05 NOTE — Progress Notes (Signed)
Subjective:    Patient ID: Diane Steele, female    DOB: 1960-10-10, 59 y.o.   MRN: [id number]  HPI pt is referred by Dr Posey Pronto, for diabetes.  Pt states DM was dx'ed in 2035; it is complicated by CAD, PN and stage 4 CRI; she has never been on insulin; pt says her diet is good, but exercise is not good; she has never had GDM, pancreatitis, pancreatic surgery, severe hypoglycemia or DKA.  She takes 2 oral meds.  She says cbg varies from 89-200.  Pt says she could not give herself insulin.   Past Medical History:  Diagnosis Date  . Anginal pain ([place])   . Anxiety   . Arthritis   . CHF (congestive heart failure) ([place])   . Coronary artery disease   . Depression   . Diabetes mellitus without complication ([place])   . GERD (gastroesophageal reflux disease)   . High cholesterol   . Hypertension   . Myocardial infarction ([place])    " MILD "  IN 1997  . Neuromuscular disorder ([place])    DIABETIC NEUROPATHY  . Shortness of breath     Past Surgical History:  Procedure Laterality Date  . CHOLECYSTECTOMY    . CORONARY ARTERY BYPASS GRAFT    . CORONARY STENT PLACEMENT    . CYST EXCISION     CHEST & WRIST    Social History   Socioeconomic History  . Marital status: Married    Spouse name: Not on file  . Number of children: Not on file  . Years of education: Not on file  . Highest education level: Not on file  Occupational History  . Not on file  Tobacco Use  . Smoking status: Former Smoker    Types: Cigarettes    Start date: 01/25/2002  . Smokeless tobacco: Never Used  Substance and Sexual Activity  . Alcohol use: Yes    Comment: SOCIAL  . Drug use: No  . Sexual activity: Not on file  Other Topics Concern  . Not on file  Social History Narrative  . Not on file   Social Determinants of Health   Financial Resource Strain:   . Difficulty of Paying Living Expenses: Not on file  Food Insecurity:   . Worried About Charity fundraiser in the Last Year: Not on file  . Ran Out of  Food in the Last Year: Not on file  Transportation Needs:   . Lack of Transportation (Medical): Not on file  . Lack of Transportation (Non-Medical): Not on file  Physical Activity:   . Days of Exercise per Week: Not on file  . Minutes of Exercise per Session: Not on file  Stress:   . Feeling of Stress : Not on file  Social Connections:   . Frequency of Communication with Friends and Family: Not on file  . Frequency of Social Gatherings with Friends and Family: Not on file  . Attends Religious Services: Not on file  . Active Member of Clubs or Organizations: Not on file  . Attends Archivist Meetings: Not on file  . Marital Status: Not on file  Intimate Partner Violence:   . Fear of Current or Ex-Partner: Not on file  . Emotionally Abused: Not on file  . Physically Abused: Not on file  . Sexually Abused: Not on file    Current Outpatient Medications on File Prior to Visit  Medication Sig Dispense Refill  . amiodarone (PACERONE) 200 MG tablet Take 100  mg by mouth daily.    Marland Kitchen amLODipine (NORVASC) 2.5 MG tablet 10 mg.    . apixaban (ELIQUIS) 5 MG TABS tablet Take 1 tablet (5 mg total) by mouth 2 (two) times daily. (Patient taking differently: Take 2.5 mg by mouth 2 (two) times daily. ) 60 tablet 3  . apixaban (ELIQUIS) 5 MG TABS tablet Eliquis 5 mg tablet    . aspirin EC 81 MG tablet Take 81 mg by mouth daily.    Marland Kitchen atorvastatin (LIPITOR) 20 MG tablet Take 20 mg by mouth daily.    . furosemide (LASIX) 20 MG tablet Take 40 mg by mouth daily.     . isosorbide mononitrate (IMDUR) 60 MG 24 hr tablet Take 60 mg by mouth daily.    . metoprolol tartrate (LOPRESSOR) 25 MG tablet Take 1 tablet (25 mg total) by mouth 2 (two) times daily. (Patient taking differently: Take 12.5 mg by mouth 2 (two) times daily. ) 60 tablet 3  . Multiple Vitamin (MULTI-VITAMIN DAILY PO) Take 1 tablet by mouth daily.    . nitroGLYCERIN (NITROSTAT) 0.4 MG SL tablet Place 0.4 mg under the tongue every 5 (five)  minutes as needed for chest pain.    Marland Kitchen olmesartan (BENICAR) 20 MG tablet Take 20 mg by mouth daily.    . potassium chloride (K-DUR) 10 MEQ tablet Take 10 mEq by mouth daily.    . Ramipril (ALTACE PO) ramipril    . ULTRAM 50 MG tablet Take 50 mg by mouth every 6 (six) hours.     No current facility-administered medications on file prior to visit.    No Known Allergies  Family History  Problem Relation Age of Onset  . Breast cancer Maternal Aunt   . Diabetes Mother   . Diabetes Father     BP 138/80   Pulse 64   Ht 5\' 5"  (1.651 m)   Wt 270 lb (122.5 kg)   SpO2 99%   BMI 44.93 kg/m     Review of Systems denies weight loss, blurry vision, chest pain, sob, n/v, urinary frequency, memory loss, and depression.      Objective:   Physical Exam VITAL SIGNS:  See vs page GENERAL: no distress Pulses: dorsalis pedis intact bilat.   MSK: no deformity of the feet CV: 2+ right, and 1+ leg leg edema Skin:  no ulcer on the feet.  normal color and temp on the feet. Old healed surgical scar (vein harvest) at the right leg Neuro: sensation is intact to touch on the feet, but decreased from normal Ext: there is bilateral onychomycosis of the toenails.    Creat=2.1  A1c=9.2%  I have reviewed outside records, and summarized: Pt was noted to have elevated A1c, and referred here.  Pt was noted to be taking meds as rx'ed, and tolerating well.  Insomnia was also addressed.      Assessment & Plan:  Type 2 DM, with stage 4 CRI.  Uncontrolled.  I believe she could take insulin, but we'll hold off for now.    Patient Instructions  good diet and exercise significantly improve the control of your diabetes.  please let me know if you wish to be referred to a dietician.  high blood sugar is very risky to your health.  you should see an eye doctor and dentist every year.  It is very important to get all recommended vaccinations.  Controlling your blood pressure and cholesterol drastically reduces  the damage diabetes does to your body.  Those who smoke should quit.  Please discuss these with your doctor.  check your blood sugar 4 times a day.  vary the time of day when you check, between before the 3 meals, and at bedtime.  also check if you have symptoms of your blood sugar being too high or too low.  please keep a record of the readings and bring it to your next appointment here (or you can bring the meter itself).  You can write it on any piece of paper.  please call us sooner if your blood sugar goes below 70, or if you have a lot of readings over 200. We will need to take this complex situation in stages.   For now, please: Please reduce the glipizide to breakfast only, and: Change the tradjenta to "Rybelsus" (to avoid nausea, take 1/2 pill the first week).   Please come back for a follow-up appointment in 6 weeks.

## 2020-04-11 ENCOUNTER — Telehealth: Payer: Self-pay | Admitting: Endocrinology

## 2020-04-11 DIAGNOSIS — E1122 Type 2 diabetes mellitus with diabetic chronic kidney disease: Secondary | ICD-10-CM

## 2020-04-11 MED ORDER — ONETOUCH ULTRASOFT LANCETS MISC: 12 refills | Status: AC

## 2020-04-11 MED ORDER — ONETOUCH VERIO VI STRP: ORAL_STRIP | 12 refills | Status: DC

## 2020-04-11 MED ORDER — ONETOUCH VERIO W/DEVICE KIT: PACK | 0 refills | Status: AC

## 2020-04-11 NOTE — Telephone Encounter (Signed)
Please check cbg qid.  Any brand is fine with me

## 2020-04-11 NOTE — Telephone Encounter (Signed)
Patient requests new GG'Y for OneTouch Lancets and OneTouch Test Strips be sent asap to: [id number] - [place], [place] Phone:  [phone]  Fax:  [phone]     Patient states she tests 4 x per day. Patient is out of the above.

## 2020-04-11 NOTE — Telephone Encounter (Signed)
Prescription sent to pharmacy, patient aware 

## 2020-04-11 NOTE — Addendum Note (Signed)
Addended by: Amado Coe on: 04/11/2020 03:42 PM   Modules accepted: Orders

## 2020-04-11 NOTE — Telephone Encounter (Signed)
Rx lancet and test strips are not on medication list. Pt stated --have one touch meter prescribe by provider. Please advise

## 2020-05-22 ENCOUNTER — Ambulatory Visit: Payer: Medicare Other | Admitting: Endocrinology

## 2020-07-30 ENCOUNTER — Other Ambulatory Visit: Payer: Self-pay | Admitting: Endocrinology

## 2020-08-20 ENCOUNTER — Other Ambulatory Visit: Payer: Self-pay | Admitting: Family

## 2020-08-20 DIAGNOSIS — Z1231 Encounter for screening mammogram for malignant neoplasm of breast: Secondary | ICD-10-CM

## 2020-08-29 ENCOUNTER — Encounter (INDEPENDENT_AMBULATORY_CARE_PROVIDER_SITE_OTHER): Payer: Medicare Other | Admitting: Ophthalmology

## 2020-10-11 ENCOUNTER — Ambulatory Visit: Payer: Medicare Other

## 2020-11-21 ENCOUNTER — Other Ambulatory Visit: Payer: Self-pay | Admitting: Endocrinology

## 2020-11-26 ENCOUNTER — Ambulatory Visit: Payer: Medicare Other

## 2020-12-20 ENCOUNTER — Other Ambulatory Visit: Payer: Self-pay | Admitting: Endocrinology

## 2021-01-06 ENCOUNTER — Other Ambulatory Visit: Payer: Self-pay

## 2021-01-06 ENCOUNTER — Ambulatory Visit (INDEPENDENT_AMBULATORY_CARE_PROVIDER_SITE_OTHER): Payer: Medicare Other | Admitting: Endocrinology

## 2021-01-06 VITALS — BP 120/70 | HR 86 | Ht 65.0 in | Wt 250.0 lb

## 2021-01-06 DIAGNOSIS — N184 Chronic kidney disease, stage 4 (severe): Secondary | ICD-10-CM

## 2021-01-06 DIAGNOSIS — E1122 Type 2 diabetes mellitus with diabetic chronic kidney disease: Secondary | ICD-10-CM | POA: Diagnosis not present

## 2021-01-06 LAB — POCT GLYCOSYLATED HEMOGLOBIN (HGB A1C): Hemoglobin A1C: 6.5 % — AB (ref 4.0–5.6)

## 2021-01-06 MED ORDER — ONETOUCH VERIO VI STRP: 1.0000 | ORAL_STRIP | Freq: Every day | 3 refills | Status: DC

## 2021-01-06 MED ORDER — RYBELSUS 7 MG PO TABS
1.0000 | ORAL_TABLET | Freq: Every morning | ORAL | 3 refills | Status: DC
Start: 2021-01-06 — End: 2021-04-08

## 2021-01-06 MED ORDER — GLIPIZIDE 5 MG PO TABS: 5.0000 mg | ORAL_TABLET | Freq: Every day | ORAL | 3 refills | Status: DC

## 2021-01-06 NOTE — Patient Instructions (Addendum)
check your blood sugar 4 times a day.  vary the time of day when you check, between before the 3 meals, and at bedtime.  also check if you have symptoms of your blood sugar being too high or too low.  please keep a record of the readings and bring it to your next appointment here (or you can bring the meter itself).  You can write it on any piece of paper.  please call us sooner if your blood sugar goes below 70, or if you have a lot of readings over 200. We will need to take this complex situation in stages.   For now, please: Please reduce the glipizide to 5 mg per day, and: Please continue the same Rybelsus. Please come back for a follow-up appointment in 3 months.

## 2021-01-06 NOTE — Progress Notes (Signed)
Subjective:    Patient ID: Diane Steele, female    DOB: 05-Jun-1961, 61 y.o.   MRN: [id number]  HPI Pt returns for f/u of diabetes mellitus: DM type: 2 Dx'ed: 1191 Complications: CAD, PN and stage 4 CRI Therapy: 2 oral meds. GDM: never DKA: never Severe hypoglycemia: never Pancreatitis: never Pancreatic imaging: normal on 2020 CT SDOH: Pt says she could not give herself insulin.  Other: pt states she feels well in general.  She takes meds as rx'ed.  She stopped metformin, due to CRI.   Past Medical History:  Diagnosis Date   Anginal pain ([place])    Anxiety    Arthritis    CHF (congestive heart failure) ([facility])    Coronary artery disease    Depression    Diabetes mellitus without complication ([facility])    GERD (gastroesophageal reflux disease)    High cholesterol    Hypertension    Myocardial infarction ([place])    " MILD "  IN 1997   Neuromuscular disorder ([facility])    DIABETIC NEUROPATHY   Shortness of breath     Past Surgical History:  Procedure Laterality Date   CHOLECYSTECTOMY     CORONARY ARTERY BYPASS GRAFT     CORONARY STENT PLACEMENT     CYST EXCISION     CHEST & WRIST    Social History   Socioeconomic History   Marital status: Married    Spouse name: Not on file   Number of children: Not on file   Years of education: Not on file   Highest education level: Not on file  Occupational History   Not on file  Tobacco Use   Smoking status: Former    Types: Cigarettes    Start date: 01/25/2002   Smokeless tobacco: Never  Substance and Sexual Activity   Alcohol use: Yes    Comment: SOCIAL   Drug use: No   Sexual activity: Not on file  Other Topics Concern   Not on file  Social History Narrative   Not on file   Social Determinants of Health   Financial Resource Strain: Not on file  Food Insecurity: Not on file  Transportation Needs: Not on file  Physical Activity: Not on file  Stress: Not on file  Social Connections: Not on file  Intimate Partner  Violence: Not on file    Current Outpatient Medications on File Prior to Visit  Medication Sig Dispense Refill   amiodarone (PACERONE) 200 MG tablet Take 100 mg by mouth daily.     amLODipine (NORVASC) 2.5 MG tablet 10 mg.     apixaban (ELIQUIS) 5 MG TABS tablet Take 1 tablet (5 mg total) by mouth 2 (two) times daily. (Patient taking differently: Take 2.5 mg by mouth 2 (two) times daily.) 60 tablet 3   apixaban (ELIQUIS) 5 MG TABS tablet Eliquis 5 mg tablet     aspirin EC 81 MG tablet Take 81 mg by mouth daily.     atorvastatin (LIPITOR) 20 MG tablet Take 20 mg by mouth daily.     Blood Glucose Monitoring Suppl (ONETOUCH VERIO) w/Device KIT Use 1-4 times daily as needed/directed DX E11.9 1 kit 0   furosemide (LASIX) 20 MG tablet Take 40 mg by mouth daily.      isosorbide mononitrate (IMDUR) 60 MG 24 hr tablet Take 60 mg by mouth daily.     Lancets (ONETOUCH ULTRASOFT) lancets Use 1-4 times daily as needed/directed  DX E11.9 200 each 12   metoprolol tartrate (  LOPRESSOR) 25 MG tablet Take 1 tablet (25 mg total) by mouth 2 (two) times daily. (Patient taking differently: Take 12.5 mg by mouth 2 (two) times daily.) 60 tablet 3   Multiple Vitamin (MULTI-VITAMIN DAILY PO) Take 1 tablet by mouth daily.     nitroGLYCERIN (NITROSTAT) 0.4 MG SL tablet Place 0.4 mg under the tongue every 5 (five) minutes as needed for chest pain.     olmesartan (BENICAR) 20 MG tablet Take 20 mg by mouth daily.     potassium chloride (K-DUR) 10 MEQ tablet Take 10 mEq by mouth daily.     Ramipril (ALTACE PO) ramipril     ULTRAM 50 MG tablet Take 50 mg by mouth every 6 (six) hours.     No current facility-administered medications on file prior to visit.    No Known Allergies  Family History  Problem Relation Age of Onset   Breast cancer Maternal Aunt    Diabetes Mother    Diabetes Father     BP 120/70 (BP Location: Right Arm, Patient Position: Sitting, Cuff Size: Large)   Pulse 86   Ht '5\' 5"'  (1.651 m)   Wt 250  lb (113.4 kg)   SpO2 98%   BMI 41.60 kg/m   Review of Systems She denies hypoglycemia    Objective:   Physical Exam Pulses: dorsalis pedis intact bilat.   MSK: no deformity of the feet CV: 2+ right, and 1+ leg leg edema.  Skin:  no ulcer on the feet.  normal color and temp on the feet. Old healed surgical scar (vein harvest) at the right leg Neuro: sensation is intact to touch on the feet, but decreased from normal.   Ext: there is bilateral onychomycosis of the toenails.    Lab Results  Component Value Date   HGBA1C 6.5 (A) 01/06/2021   Lab Results  Component Value Date   CREATININE 2.2 (A) 01/22/2020   BUN 48 (A) 01/22/2020   NA 135 (A) 01/22/2020   K 5.3 01/22/2020   CL 108 01/22/2020   CO2 22 01/22/2020      Assessment & Plan:  Insulin-requiring type 2 DM.  CRI: he should reduce glipizide.   Patient Instructions  check your blood sugar 4 times a day.  vary the time of day when you check, between before the 3 meals, and at bedtime.  also check if you have symptoms of your blood sugar being too high or too low.  please keep a record of the readings and bring it to your next appointment here (or you can bring the meter itself).  You can write it on any piece of paper.  please call us sooner if your blood sugar goes below 70, or if you have a lot of readings over 200. We will need to take this complex situation in stages.   For now, please: Please reduce the glipizide to 5 mg per day, and: Please continue the same Rybelsus. Please come back for a follow-up appointment in 3 months.

## 2021-01-27 ENCOUNTER — Other Ambulatory Visit: Payer: Self-pay | Admitting: Endocrinology

## 2021-01-27 DIAGNOSIS — N184 Chronic kidney disease, stage 4 (severe): Secondary | ICD-10-CM

## 2021-01-27 DIAGNOSIS — E1122 Type 2 diabetes mellitus with diabetic chronic kidney disease: Secondary | ICD-10-CM

## 2021-04-08 ENCOUNTER — Other Ambulatory Visit: Payer: Self-pay

## 2021-04-08 ENCOUNTER — Ambulatory Visit (INDEPENDENT_AMBULATORY_CARE_PROVIDER_SITE_OTHER): Payer: Medicare Other | Admitting: Endocrinology

## 2021-04-08 VITALS — BP 150/80 | HR 106 | Ht 65.0 in | Wt 263.6 lb

## 2021-04-08 DIAGNOSIS — E1122 Type 2 diabetes mellitus with diabetic chronic kidney disease: Secondary | ICD-10-CM | POA: Diagnosis not present

## 2021-04-08 DIAGNOSIS — N184 Chronic kidney disease, stage 4 (severe): Secondary | ICD-10-CM | POA: Diagnosis not present

## 2021-04-08 LAB — POCT GLYCOSYLATED HEMOGLOBIN (HGB A1C): Hemoglobin A1C: 7.4 % — AB (ref 4.0–5.6)

## 2021-04-08 MED ORDER — RYBELSUS 14 MG PO TABS: 14.0000 mg | ORAL_TABLET | Freq: Every day | ORAL | 3 refills | Status: DC

## 2021-04-08 MED ORDER — ONETOUCH VERIO VI STRP: 1.0000 | ORAL_STRIP | Freq: Two times a day (BID) | 3 refills | Status: AC

## 2021-04-08 NOTE — Patient Instructions (Addendum)
Your blood pressure is high today.  Please see your primary care provider soon, to have it rechecked check your blood sugar 4 times a day.  vary the time of day when you check, between before the 3 meals, and at bedtime.  also check if you have symptoms of your blood sugar being too high or too low.  please keep a record of the readings and bring it to your next appointment here (or you can bring the meter itself).  You can write it on any piece of paper.  please call us sooner if your blood sugar goes below 70, or if you have a lot of readings over 200. We will need to take this complex situation in stages.   For now, please: Please increase the Rybelsus to 14 mg per day, and:  Please continue the same glipizide. Please come back for a follow-up appointment in 3 months.

## 2021-04-08 NOTE — Progress Notes (Signed)
Subjective:    Patient ID: Diane Steele, female    DOB: 06-18-1961, 60 y.o.   MRN: [id number]  HPI Pt returns for f/u of diabetes mellitus: DM type: 2 Dx'ed: 5462 Complications: CAD, PN and stage 4 CRI Therapy: 2 oral meds. GDM: never DKA: never Severe hypoglycemia: never Pancreatitis: never Pancreatic imaging: normal on 2020 CT SDOH: Pt says she could not give herself insulin.   Other: pt states she feels well in general.  She takes meds as rx'ed.  no cbg record, but states cbg's vary from 118-200.   Past Medical History:  Diagnosis Date   Anginal pain ([place])    Anxiety    Arthritis    CHF (congestive heart failure) (HCC)    Coronary artery disease    Depression    Diabetes mellitus without complication (HCC)    GERD (gastroesophageal reflux disease)    High cholesterol    Hypertension    Myocardial infarction ([place])    " MILD "  IN 1997   Neuromuscular disorder (HCC)    DIABETIC NEUROPATHY   Shortness of breath     Past Surgical History:  Procedure Laterality Date   CHOLECYSTECTOMY     CORONARY ARTERY BYPASS GRAFT     CORONARY STENT PLACEMENT     CYST EXCISION     CHEST & WRIST    Social History   Socioeconomic History   Marital status: Married    Spouse name: Not on file   Number of children: Not on file   Years of education: Not on file   Highest education level: Not on file  Occupational History   Not on file  Tobacco Use   Smoking status: Former    Types: Cigarettes    Start date: 01/25/2002   Smokeless tobacco: Never  Substance and Sexual Activity   Alcohol use: Yes    Comment: SOCIAL   Drug use: No   Sexual activity: Not on file  Other Topics Concern   Not on file  Social History Narrative   Not on file   Social Determinants of Health   Financial Resource Strain: Not on file  Food Insecurity: Not on file  Transportation Needs: Not on file  Physical Activity: Not on file  Stress: Not on file  Social Connections: Not on file   Intimate Partner Violence: Not on file    Current Outpatient Medications on File Prior to Visit  Medication Sig Dispense Refill   amiodarone (PACERONE) 200 MG tablet Take 100 mg by mouth daily.     amLODipine (NORVASC) 2.5 MG tablet 10 mg.     apixaban (ELIQUIS) 5 MG TABS tablet Take 1 tablet (5 mg total) by mouth 2 (two) times daily. (Patient taking differently: Take 2.5 mg by mouth 2 (two) times daily.) 60 tablet 3   apixaban (ELIQUIS) 5 MG TABS tablet Eliquis 5 mg tablet     aspirin EC 81 MG tablet Take 81 mg by mouth daily.     atorvastatin (LIPITOR) 20 MG tablet Take 20 mg by mouth daily.     Blood Glucose Monitoring Suppl (ONETOUCH VERIO) w/Device KIT Use 1-4 times daily as needed/directed DX E11.9 1 kit 0   furosemide (LASIX) 20 MG tablet Take 40 mg by mouth daily.      glipiZIDE (GLUCOTROL) 5 MG tablet Take 1 tablet (5 mg total) by mouth daily before breakfast. 90 tablet 3   isosorbide mononitrate (IMDUR) 60 MG 24 hr tablet Take 60 mg by mouth  daily.     Lancets (ONETOUCH ULTRASOFT) lancets Use 1-4 times daily as needed/directed  DX E11.9 200 each 12   metoprolol tartrate (LOPRESSOR) 25 MG tablet Take 1 tablet (25 mg total) by mouth 2 (two) times daily. (Patient taking differently: Take 12.5 mg by mouth 2 (two) times daily.) 60 tablet 3   Multiple Vitamin (MULTI-VITAMIN DAILY PO) Take 1 tablet by mouth daily.     nitroGLYCERIN (NITROSTAT) 0.4 MG SL tablet Place 0.4 mg under the tongue every 5 (five) minutes as needed for chest pain.     olmesartan (BENICAR) 20 MG tablet Take 20 mg by mouth daily.     potassium chloride (K-DUR) 10 MEQ tablet Take 10 mEq by mouth daily.     Ramipril (ALTACE PO) ramipril     ULTRAM 50 MG tablet Take 50 mg by mouth every 6 (six) hours.     No current facility-administered medications on file prior to visit.    No Known Allergies  Family History  Problem Relation Age of Onset   Breast cancer Maternal Aunt    Diabetes Mother    Diabetes Father      BP (!) 150/80 (BP Location: Right Arm, Patient Position: Sitting, Cuff Size: Large)   Pulse (!) 106   Ht _0  (1.651 m)   Wt 263 lb 9.6 oz (119.6 kg)   SpO2 96%   BMI 43.87 kg/m    Review of Systems She denies hypoglycemia.      Objective:   Physical Exam Pulses: dorsalis pedis intact bilat.   MSK: no deformity of the feet CV: 3+ right, and 2+ leg leg edema.  Skin:  no ulcer on the feet.  normal color and temp on the feet. Old healed surgical scar (vein harvest) at the right leg.   Neuro: sensation is intact to touch on the feet, but decreased from normal.   Ext: there is bilateral onychomycosis of the toenails.    A1c=7.6%     Assessment & Plan:  Type 2 DM: uncontrolled.   Patient Instructions  Your blood pressure is high today.  Please see your primary care provider soon, to have it rechecked check your blood sugar 4 times a day.  vary the time of day when you check, between before the 3 meals, and at bedtime.  also check if you have symptoms of your blood sugar being too high or too low.  please keep a record of the readings and bring it to your next appointment here (or you can bring the meter itself).  You can write it on any piece of paper.  please call us sooner if your blood sugar goes below 70, or if you have a lot of readings over 200. We will need to take this complex situation in stages.   For now, please: Please increase the Rybelsus to 14 mg per day, and:  Please continue the same glipizide. Please come back for a follow-up appointment in 3 months.

## 2021-05-20 ENCOUNTER — Telehealth: Payer: Self-pay

## 2021-05-20 NOTE — Telephone Encounter (Signed)
NOTES SCANNED TO REFERRAL 

## 2021-06-26 ENCOUNTER — Other Ambulatory Visit: Payer: Self-pay

## 2021-06-26 ENCOUNTER — Encounter: Payer: Self-pay | Admitting: Interventional Cardiology

## 2021-06-26 ENCOUNTER — Encounter: Payer: Self-pay | Admitting: *Deleted

## 2021-06-26 ENCOUNTER — Ambulatory Visit: Payer: Medicare Other | Admitting: Interventional Cardiology

## 2021-06-26 VITALS — BP 110/82 | HR 52 | Ht 65.0 in | Wt 248.0 lb

## 2021-06-26 DIAGNOSIS — I10 Essential (primary) hypertension: Secondary | ICD-10-CM

## 2021-06-26 DIAGNOSIS — I25118 Atherosclerotic heart disease of native coronary artery with other forms of angina pectoris: Secondary | ICD-10-CM | POA: Diagnosis not present

## 2021-06-26 DIAGNOSIS — R0609 Other forms of dyspnea: Secondary | ICD-10-CM | POA: Diagnosis not present

## 2021-06-26 DIAGNOSIS — E1159 Type 2 diabetes mellitus with other circulatory complications: Secondary | ICD-10-CM | POA: Diagnosis not present

## 2021-06-26 DIAGNOSIS — E782 Mixed hyperlipidemia: Secondary | ICD-10-CM

## 2021-06-26 MED ORDER — ROSUVASTATIN CALCIUM 40 MG PO TABS: 40.0000 mg | ORAL_TABLET | Freq: Every day | ORAL | 3 refills | Status: DC

## 2021-06-26 NOTE — Patient Instructions (Signed)
Medication Instructions:  Your physician has recommended you make the following change in your medication: Stop Amiodarone Stop Atorvastatin. Start Rosuvastatin 40 mg by mouth daily  *If you need a refill on your cardiac medications before your next appointment, please call your pharmacy*   Lab Work: Your physician recommends that you return for lab work in: 3 months.  Lipid and liver profiles. This will be fasting. The lab opens at 7:30 AM  If you have labs (blood work) drawn today and your tests are completely normal, you will receive your results only by: [place] (if you have MyChart) OR A paper copy in the mail If you have any lab test that is abnormal or we need to change your treatment, we will call you to review the results.   Testing/Procedures: Your physician has requested that you have a lexiscan myoview. For further information please visit [email or web address]. Please follow instruction sheet, as given.    Follow-Up: At [facility], you and your health needs are our priority.  As part of our continuing mission to provide you with exceptional heart care, we have created designated Provider Care Teams.  These Care Teams include your primary Cardiologist (physician) and Advanced Practice Providers (APPs -  Physician Assistants and Nurse Practitioners) who all work together to provide you with the care you need, when you need it.  We recommend signing up for the patient portal called "MyChart".  Sign up information is provided on this After Visit Summary.  MyChart is used to connect with patients for Virtual Visits (Telemedicine).  Patients are able to view lab/test results, encounter notes, upcoming appointments, etc.  Non-urgent messages can be sent to your provider as well.   To learn more about what you can do with MyChart, go to [email or web address].    Your next appointment:   3 month(s)  The format for your next appointment:   In Person  Provider:    Larae Grooms, MD     Other Instructions High-Fiber Eating Plan Fiber, also called dietary fiber, is a type of carbohydrate. It is found foods such as fruits, vegetables, whole grains, and beans. A high-fiber diet can have many health benefits. Your health care provider may recommend a high-fiber diet to help: Prevent constipation. Fiber can make your bowel movements more regular. Lower your cholesterol. Relieve the following conditions: Inflammation of veins in the anus (hemorrhoids). Inflammation of specific areas of the digestive tract (uncomplicated diverticulosis). A problem of the large intestine, also called the colon, that sometimes causes pain and diarrhea (irritable bowel syndrome, or IBS). Prevent overeating as part of a weight-loss plan. Prevent heart disease, type 2 diabetes, and certain cancers. What are tips for following this plan? Reading food labels  Check the nutrition facts label on food products for the amount of dietary fiber. Choose foods that have 5 grams of fiber or more per serving. The goals for recommended daily fiber intake include: Men (age 61 or younger): 34-38 g. Men (over age 55): 28-34 g. Women (age 56 or younger): 25-28 g. Women (over age 56): 22-25 g. Your daily fiber goal is _____________ g. Shopping Choose whole fruits and vegetables instead of processed forms, such as apple juice or applesauce. Choose a wide variety of high-fiber foods such as avocados, lentils, oats, and kidney beans. Read the nutrition facts label of the foods you choose. Be aware of foods with added fiber. These foods often have high sugar and sodium amounts per serving. Cooking Use whole-grain flour for baking and  cooking. Cook with brown rice instead of white rice. Meal planning Start the day with a breakfast that is high in fiber, such as a cereal that contains 5 g of fiber or more per serving. Eat breads and cereals that are made with whole-grain flour instead of  refined flour or white flour. Eat brown rice, bulgur wheat, or millet instead of white rice. Use beans in place of meat in soups, salads, and pasta dishes. Be sure that half of the grains you eat each day are whole grains. General information You can get the recommended daily intake of dietary fiber by: Eating a variety of fruits, vegetables, grains, nuts, and beans. Taking a fiber supplement if you are not able to take in enough fiber in your diet. It is better to get fiber through food than from a supplement. Gradually increase how much fiber you consume. If you increase your intake of dietary fiber too quickly, you may have bloating, cramping, or gas. Drink plenty of water to help you digest fiber. Choose high-fiber snacks, such as berries, raw vegetables, nuts, and popcorn. What foods should I eat? Fruits Berries. Pears. Apples. Oranges. Avocado. Prunes and raisins. Dried figs. Vegetables Sweet potatoes. Spinach. Kale. Artichokes. Cabbage. Broccoli. Cauliflower. Green peas. Carrots. Squash. Grains Whole-grain breads. Multigrain cereal. Oats and oatmeal. Brown rice. Barley. Bulgur wheat. Brent. Quinoa. Bran muffins. Popcorn. Rye wafer crackers. Meats and other proteins Navy beans, kidney beans, and pinto beans. Soybeans. Split peas. Lentils. Nuts and seeds. Dairy Fiber-fortified yogurt. Beverages Fiber-fortified soy milk. Fiber-fortified orange juice. Other foods Fiber bars. The items listed above may not be a complete list of recommended foods and beverages. Contact a dietitian for more information. What foods should I avoid? Fruits Fruit juice. Cooked, strained fruit. Vegetables Fried potatoes. Canned vegetables. Well-cooked vegetables. Grains White bread. Pasta made with refined flour. White rice. Meats and other proteins Fatty cuts of meat. Fried chicken or fried fish. Dairy Milk. Yogurt. Cream cheese. Sour cream. Fats and oils Butters. Beverages Soft drinks. Other  foods Cakes and pastries. The items listed above may not be a complete list of foods and beverages to avoid. Talk with your dietitian about what choices are best for you. Summary Fiber is a type of carbohydrate. It is found in foods such as fruits, vegetables, whole grains, and beans. A high-fiber diet has many benefits. It can help to prevent constipation, lower blood cholesterol, aid weight loss, and reduce your risk of heart disease, diabetes, and certain cancers. Increase your intake of fiber gradually. Increasing fiber too quickly may cause cramping, bloating, and gas. Drink plenty of water while you increase the amount of fiber you consume. The best sources of fiber include whole fruits and vegetables, whole grains, nuts, seeds, and beans. This information is not intended to replace advice given to you by your health care provider. Make sure you discuss any questions you have with your health care provider. Document Revised: 10/12/2019 Document Reviewed: 10/12/2019 Elsevier Patient Education  2022 [employer].

## 2021-06-26 NOTE — Progress Notes (Signed)
Cardiology Office Note   Date:  06/26/2021   ID:  Diane Steele, DOB 04-05-1961, MRN [id number]  PCP:  Sonia Side., FNP    Chief Complaint  Patient presents with   Establish Care   RF for CAD  Wt Readings from Last 3 Encounters:  06/26/21 248 lb (112.5 kg)  04/08/21 263 lb 9.6 oz (119.6 kg)  01/06/21 250 lb (113.4 kg)       History of Present Illness: Diane Steele is a 61 y.o. female who is being seen today for the evaluation of CAD at the request of Sonia Side., FNP.  Parents with early CAD.  Mother with CABG at 36. Brother died from MI at 34.  Per the referral, patient has a history of hypertension, heart failure and coronary artery disease.  She had seen Dr. Terrence Dupont in the past but wants to transfer care to [facility] heart care.  Review of the record shows she has a history of CABG in 1997  (LIMA to LAD, SVG to RCA, SVG to first diagonal, SVG to second obtuse marginal) and multiple PCI in the past.  In 1997 she had PTCA of the saphenous vein graft to the RCA.  In 2004, she underwent catheterization.  She had 3.5 x 24 Taxus stent placement to the SVG to the first diagonal.  Later in 2004, she had stent placement from the proximal to distal circumflex.  There were 2 long overlapping stents, 3.75 x 33 Cypher and 3.0 x 38 stent.  Last In 2005 showed severe three-vessel disease.  Patent stents in the circumflex.  Patent SVG to OM 2.  Patent SVG to diagonal.  Patent SVG to distal RCA but there was concern that the distal graft was degenerated and there was only TIMI II flow.  This was not suitable for any intervention per the report.  She was hospitalized in 2014.  She had paroxysmal atrial fibrillation at that time.  She was started on amiodarone.  She was started on apixaban.  She converted to normal sinus rhythm.  She also had a negative nuclear study at that time.  2020 echo: "The left ventricle has normal systolic function with an ejection  fraction of 60-65%. The  cavity size was normal. Left ventricular diastolic  Doppler parameters are consistent with pseudonormalization.   2. The right ventricle has normal systolic function. The cavity was  normal. There is no increase in right ventricular wall thickness.   3. Right atrial size was mildly dilated.   4. The mitral valve is normal in structure.   5. The tricuspid valve is normal in structure.   6. The aortic valve is normal in structure.   7. The aortic root and ascending aorta are normal in size and structure.   8. The interatrial septum was not assessed. "  She feels more shortness of breath of late, even with housework.  Sx for the past month.  She tries to walk for exercise but has DOE.  Not like what she had with her prior PCI.   Denies : exertional Chest pain. Dizziness. Leg edema.  Palpitations. Paroxysmal nocturnal dyspnea. Shortness of breath. Syncope.  Has insomnia.        Past Medical History:  Diagnosis Date   A-fib ([place])    Anginal pain ([place])    Anxiety    Aortic atherosclerosis (HCC)    Arthritis    Back pain    Bradycardia    CAD (coronary  artery disease)    Caregiver stress    CHF (congestive heart failure) (HCC)    CKD (chronic kidney disease)    Coagulation defect ([place])    Coronary artery disease    Depression    Diabetes mellitus without complication (HCC)    GERD (gastroesophageal reflux disease)    Hair loss    High cholesterol    History of DVT (deep vein thrombosis)    History of tobacco use    Hypertension    Insomnia    Long term current use of oral hypoglycemic drug    Lower leg edema    Memory loss    Morbid obesity ([place])    Myocardial infarction ([place])    " MILD "  IN 1997   Nausea and vomiting    Neuromuscular disorder (HCC)    DIABETIC NEUROPATHY   OSA (obstructive sleep apnea)    Periumbilical abdominal pain    Shortness of breath    Type 2 diabetes mellitus with both eyes affected by mild nonproliferative retinopathy without macular edema,  without long-term current use of insulin (HCC)    Type 2 diabetes mellitus with microalbuminuria (HCC)    Type 2 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     amLODipine (NORVASC) 10 MG tablet Take 10 mg by mouth daily.     apixaban (ELIQUIS) 5 MG TABS tablet Eliquis 5 mg tablet     atorvastatin (LIPITOR) 20 MG tablet Take 20 mg by mouth daily.     Blood Glucose Monitoring Suppl (ONETOUCH VERIO) w/Device KIT Use 1-4 times daily as needed/directed DX E11.9 1 kit 0   Cholecalciferol (VITAMIN D3) 1.25 MG (50000 UT) CAPS Take 1 capsule by mouth once a week.     furosemide (LASIX) 20 MG tablet Take 40 mg by mouth 2 (two) times daily.     glipiZIDE (GLUCOTROL) 5 MG tablet Take 1 tablet (5 mg total) by mouth daily before breakfast. 90 tablet 3   glucose blood (ONETOUCH VERIO) test strip 1 each by Other route 2 (two) times daily. And lancets 2/day 200 strip 3   isosorbide mononitrate (IMDUR) 60 MG 24 hr tablet Take 60 mg by mouth daily.     Lancets (ONETOUCH ULTRASOFT) lancets Use 1-4 times daily as needed/directed  DX E11.9 200 each 12   metoprolol tartrate (LOPRESSOR) 25 MG tablet Take 1 tablet (25 mg total) by mouth 2 (two) times daily. (Patient taking differently: Take 12.5 mg by mouth 2 (two) times daily.) 60 tablet 3   Multiple Vitamin (MULTI-VITAMIN DAILY PO) Take 1 tablet by mouth daily.     nitroGLYCERIN (NITROSTAT) 0.4 MG SL tablet Place 0.4 mg under the tongue every 5 (five) minutes as needed for chest pain.     potassium chloride (K-DUR) 10 MEQ tablet Take 10 mEq by mouth daily.     Semaglutide (RYBELSUS) 14 MG TABS Take 14 mg by mouth  daily. 90 tablet 3   ULTRAM 50 MG tablet Take 50 mg by mouth every 6 (six) hours.     levothyroxine (  SYNTHROID) 75 MCG tablet Take 75 mcg by mouth daily.     pantoprazole (PROTONIX) 40 MG tablet Take 40 mg by mouth daily.     No current facility-administered medications for this visit.    Allergies:   Patient has no known allergies.    Social History:  The patient  reports that she has quit smoking. Her smoking use included cigarettes. She started smoking about 19 years ago. She has never used smokeless tobacco. She reports current alcohol use. She reports that she does not use drugs.   Family History:  The patient's family history includes Breast cancer in her maternal aunt; Diabetes in her father, mother, sister, sister, sister, and sister; Heart attack in her brother; High blood pressure in her daughter.    ROS:  Please see the history of present illness.   Otherwise, review of systems are positive for DOE, fatigue.   All other systems are reviewed and negative.    PHYSICAL EXAM: VS:  BP 110/82    Pulse (!) 52    Ht '5\' 5"'  (1.651 m)    Wt 248 lb (112.5 kg)    SpO2 99%    BMI 41.27 kg/m  , BMI Body mass index is 41.27 kg/m. GEN: Well nourished, well developed, in no acute distress HEENT: normal Neck: no JVD, carotid bruits, or masses Cardiac: irregularly irregular; no murmurs, rubs, or gallops,no edema  Respiratory:  clear to auscultation bilaterally, normal work of breathing GI: soft, nontender, nondistended, + BS MS: no deformity or atrophy Skin: warm and dry, no rash Neuro:  Strength and sensation are intact Psych: euthymic mood, full affect   EKG:   The ekg ordered today demonstrates AFib, rate controlled.    Recent Labs: No results found for requested labs within last 8760 hours.   Lipid Panel No results found for: CHOL, TRIG, HDL, CHOLHDL, VLDL, LDLCALC, LDLDIRECT   Other studies Reviewed: Additional studies/ records that were reviewed today with results  demonstrating: records and labs reviewed; Cr 1.7.   ASSESSMENT AND PLAN:  CAD: Multiple RF for Hypertension, diabetes, obesity. hyperlipidemia.  Very strong family history of coronary artery disease and early onset as she had bypass surgery at age 79.  Multiple stents after that time.  Now on Eliquis for atrial fibrillation.  Dyspnea on exertion is similar to her prior angina.  Plan for nuclear stress test. AFib: Seems that she is maintaining atrial fibrillation.  She is not having symptoms.  We will stop amiodarone.  May need to increase metoprolol if heart rate increases.  We will see what her heart rate is at the time of her stress test. DM: 7.4 A1C.  High-fiber diet.  Increase walking as tolerated. Hyperlipidemia: We will stop atorvastatin 20 mg daily.  Start rosuvastatin 40 mg daily.  She will need liver and lipid tests in 3 months.  Need to manage her risk factors very aggressively given her genetic predisposition to CAD. Hypertension: Low-salt diet.  Avoid processed foods.The current medical regimen is effective;  continue present plan and medications. Morbid obesity: We spoke about the importance of weight loss.  She had lost a bunch of weight in the past through diet and exercise.  She needs to get back to be healthy lifestyle habits. CKD: avoid nephrotoxins   Current medicines are reviewed at length with the patient today.  The patient concerns regarding her medicines were addressed.  The following changes have been made:  as above  Labs/ tests ordered today include: lipids No  orders of the defined types were placed in this encounter.   Recommend 150 minutes/week of aerobic exercise Low fat, low carb, high fiber diet recommended  Disposition:   FU in 3-4 months   Signed, Larae Grooms, MD  06/26/2021 1:36 PM    [facility] [place], [place], [place]  [address] Phone: [phone]; Fax: [phone]

## 2021-07-02 ENCOUNTER — Telehealth (HOSPITAL_COMMUNITY): Payer: Self-pay | Admitting: *Deleted

## 2021-07-02 ENCOUNTER — Encounter (HOSPITAL_COMMUNITY): Payer: Self-pay | Admitting: *Deleted

## 2021-07-02 NOTE — Telephone Encounter (Signed)
Left message on voicemail in reference to upcoming appointment scheduled for 07/09/21. Phone number given for a call back so details instructions can be given. [place] Mychart letter sent

## 2021-07-09 ENCOUNTER — Other Ambulatory Visit: Payer: Self-pay

## 2021-07-09 ENCOUNTER — Ambulatory Visit (HOSPITAL_COMMUNITY): Payer: Medicare Other | Attending: Interventional Cardiology

## 2021-07-09 DIAGNOSIS — R0609 Other forms of dyspnea: Secondary | ICD-10-CM | POA: Insufficient documentation

## 2021-07-09 DIAGNOSIS — I25118 Atherosclerotic heart disease of native coronary artery with other forms of angina pectoris: Secondary | ICD-10-CM | POA: Insufficient documentation

## 2021-07-09 MED ORDER — REGADENOSON 0.4 MG/5ML IV SOLN
0.4000 mg | Freq: Once | INTRAVENOUS | Status: AC
  Administered 2021-07-09: 0.4 mg via INTRAVENOUS

## 2021-07-09 MED ORDER — TECHNETIUM TC 99M TETROFOSMIN IV KIT
32.9000 | PACK | Freq: Once | INTRAVENOUS | Status: AC | PRN
  Administered 2021-07-09: 32.9 via INTRAVENOUS
  Filled 2021-07-09: qty 33

## 2021-07-10 ENCOUNTER — Ambulatory Visit (HOSPITAL_COMMUNITY): Payer: Medicare Other | Attending: Cardiology

## 2021-07-10 LAB — MYOCARDIAL PERFUSION IMAGING
LV dias vol: 77 mL (ref 46–106)
LV sys vol: 43 mL
Nuc Stress EF: 44 %
Peak HR: 96 {beats}/min
Rest HR: 67 {beats}/min
Rest Nuclear Isotope Dose: 31.3 mCi
SDS: 2
SRS: 7
SSS: 10
ST Depression (mm): 0 mm
Stress Nuclear Isotope Dose: 32.9 mCi
TID: 0.91

## 2021-07-10 MED ORDER — TECHNETIUM TC 99M TETROFOSMIN IV KIT
31.3000 | PACK | Freq: Once | INTRAVENOUS | Status: AC | PRN
  Administered 2021-07-10: 31.3 via INTRAVENOUS
  Filled 2021-07-10: qty 32

## 2021-07-14 ENCOUNTER — Encounter: Payer: Self-pay | Admitting: Endocrinology

## 2021-07-14 ENCOUNTER — Ambulatory Visit (INDEPENDENT_AMBULATORY_CARE_PROVIDER_SITE_OTHER): Payer: Medicare Other | Admitting: Endocrinology

## 2021-07-14 ENCOUNTER — Other Ambulatory Visit: Payer: Self-pay

## 2021-07-14 ENCOUNTER — Telehealth: Payer: Self-pay | Admitting: *Deleted

## 2021-07-14 VITALS — BP 110/70 | HR 99 | Ht 65.0 in | Wt 242.8 lb

## 2021-07-14 DIAGNOSIS — R0609 Other forms of dyspnea: Secondary | ICD-10-CM

## 2021-07-14 DIAGNOSIS — I25118 Atherosclerotic heart disease of native coronary artery with other forms of angina pectoris: Secondary | ICD-10-CM

## 2021-07-14 DIAGNOSIS — E1122 Type 2 diabetes mellitus with diabetic chronic kidney disease: Secondary | ICD-10-CM

## 2021-07-14 DIAGNOSIS — N184 Chronic kidney disease, stage 4 (severe): Secondary | ICD-10-CM

## 2021-07-14 LAB — POCT GLYCOSYLATED HEMOGLOBIN (HGB A1C): Hemoglobin A1C: 7.9 % — AB (ref 4.0–5.6)

## 2021-07-14 NOTE — Patient Instructions (Addendum)
check your blood sugar once a day.  vary the time of day when you check, between before the 3 meals, and at bedtime.  also check if you have symptoms of your blood sugar being too high or too low.  please keep a record of the readings and bring it to your next appointment here (or you can bring the meter itself).  You can write it on any piece of paper.  please call us sooner if your blood sugar goes below 70, or if you have a lot of readings over 200.   Please continue the same glipizide and Rybelsus Please come back for a follow-up appointment in 3 months.

## 2021-07-14 NOTE — Telephone Encounter (Signed)
I spoke with patient and reviewed echo results with her 

## 2021-07-14 NOTE — Telephone Encounter (Signed)
Follow Up:     Patient is returning a call from today. 

## 2021-07-14 NOTE — Progress Notes (Signed)
Subjective:    Patient ID: Diane Steele, female    DOB: January 08, 1961, 61 y.o.   MRN: [id number]  HPI Pt returns for f/u of diabetes mellitus: DM type: 2 Dx'ed: 0093 Complications: CAD, PN and stage 4 CRI Therapy: 2 oral meds. GDM: never DKA: never Severe hypoglycemia: never Pancreatitis: never Pancreatic imaging: normal on 2020 CT SDOH: Pt says she could not give herself insulin.   Other: pt states she feels well in general.  She takes meds as rx'ed.  no cbg record, but states cbg's vary from 80-190.  She has intermitt leg swelling.   Past Medical History:  Diagnosis Date   A-fib (HCC)    Anginal pain (HCC)    Anxiety    Aortic atherosclerosis (HCC)    Arthritis    Back pain    Bradycardia    CAD (coronary artery disease)    Caregiver stress    CHF (congestive heart failure) (HCC)    CKD (chronic kidney disease)    Coagulation defect ([place])    Coronary artery disease    Depression    Diabetes mellitus without complication (HCC)    GERD (gastroesophageal reflux disease)    Hair loss    High cholesterol    History of DVT (deep vein thrombosis)    History of tobacco use    Hypertension    Insomnia    Long term current use of oral hypoglycemic drug    Lower leg edema    Memory loss    Morbid obesity ([place])    Myocardial infarction ([place])    " MILD "  IN 1997   Nausea and vomiting    Neuromuscular disorder (HCC)    DIABETIC NEUROPATHY   OSA (obstructive sleep apnea)    Periumbilical abdominal pain    Shortness of breath    Type 2 diabetes mellitus with both eyes affected by mild nonproliferative retinopathy without macular edema, without long-term current use of insulin (HCC)    Type 2 diabetes mellitus with microalbuminuria (HCC)    Type 2 diabetes mellitus with stage 4 chronic kidney disease (HCC)    Vitamin D deficiency     Past Surgical History:  Procedure Laterality Date   CHOLECYSTECTOMY     CORONARY ARTERY BYPASS GRAFT     CORONARY STENT PLACEMENT      CYST EXCISION     CHEST & WRIST    Social History   Socioeconomic History   Marital status: Married    Spouse name: Not on file   Number of children: Not on file   Years of education: Not on file   Highest education level: Not on file  Occupational History   Not on file  Tobacco Use   Smoking status: Former    Types: Cigarettes    Start date: 01/25/2002   Smokeless tobacco: Never  Vaping Use   Vaping Use: Never used  Substance and Sexual Activity   Alcohol use: Yes    Comment: SOCIAL   Drug use: No   Sexual activity: Not on file  Other Topics Concern   Not on file  Social History Narrative   Not on file   Social Determinants of Health   Financial Resource Strain: Not on file  Food Insecurity: Not on file  Transportation Needs: Not on file  Physical Activity: Not on file  Stress: Not on file  Social Connections: Not on file  Intimate Partner Violence: Not on file    Current Outpatient Medications on  File Prior to Visit  Medication Sig Dispense Refill   amLODipine (NORVASC) 10 MG tablet Take 10 mg by mouth daily.     apixaban (ELIQUIS) 5 MG TABS tablet Eliquis 5 mg tablet     Blood Glucose Monitoring Suppl (ONETOUCH VERIO) w/Device KIT Use 1-4 times daily as needed/directed DX E11.9 1 kit 0   Cholecalciferol (VITAMIN D3) 1.25 MG (50000 UT) CAPS Take 1 capsule by mouth once a week.     furosemide (LASIX) 20 MG tablet Take 40 mg by mouth 2 (two) times daily.     glipiZIDE (GLUCOTROL) 5 MG tablet Take 1 tablet (5 mg total) by mouth daily before breakfast. 90 tablet 3   glucose blood (ONETOUCH VERIO) test strip 1 each by Other route 2 (two) times daily. And lancets 2/day 200 strip 3   isosorbide mononitrate (IMDUR) 60 MG 24 hr tablet Take 60 mg by mouth daily.     Lancets (ONETOUCH ULTRASOFT) lancets Use 1-4 times daily as needed/directed  DX E11.9 200 each 12   levothyroxine (SYNTHROID) 75 MCG tablet Take 75 mcg by mouth daily.     metoprolol tartrate (LOPRESSOR) 25 MG  tablet Take 1 tablet (25 mg total) by mouth 2 (two) times daily. (Patient taking differently: Take 12.5 mg by mouth 2 (two) times daily.) 60 tablet 3   Multiple Vitamin (MULTI-VITAMIN DAILY PO) Take 1 tablet by mouth daily.     nitroGLYCERIN (NITROSTAT) 0.4 MG SL tablet Place 0.4 mg under the tongue every 5 (five) minutes as needed for chest pain.     pantoprazole (PROTONIX) 40 MG tablet Take 40 mg by mouth daily.     potassium chloride (K-DUR) 10 MEQ tablet Take 10 mEq by mouth daily.     rosuvastatin (CRESTOR) 40 MG tablet Take 1 tablet (40 mg total) by mouth daily. 90 tablet 3   Semaglutide (RYBELSUS) 14 MG TABS Take 14 mg by mouth daily. 90 tablet 3   ULTRAM 50 MG tablet Take 50 mg by mouth every 6 (six) hours.     No current facility-administered medications on file prior to visit.    No Known Allergies  Family History  Problem Relation Age of Onset   Diabetes Mother    Diabetes Father    Diabetes Sister    Diabetes Sister    Diabetes Sister    Diabetes Sister    Heart attack Brother    Breast cancer Maternal Aunt    High blood pressure Daughter     BP 110/70 (BP Location: Left Arm, Patient Position: Sitting, Cuff Size: Normal)    Pulse 99    Ht $R'5\' 5"'Gf$  (1.651 m)    Wt 242 lb 12.8 oz (110.1 kg)    SpO2 96%    BMI 40.40 kg/m    Review of Systems She denies hypoglycemia.      Objective:   Physical Exam    A1c=7.9%    Assessment & Plan:  Diarrhea, due to Rybelsus.  Type 2 DM: uncontrolled.  We discussed.  She wants to continue same rx, and redouble diet efforts  Patient Instructions  check your blood sugar once a day.  vary the time of day when you check, between before the 3 meals, and at bedtime.  also check if you have symptoms of your blood sugar being too high or too low.  please keep a record of the readings and bring it to your next appointment here (or you can bring the meter itself).  You can  write it on any piece of paper.  please call us sooner if your blood  sugar goes below 70, or if you have a lot of readings over 200.   Please continue the same glipizide and Rybelsus Please come back for a follow-up appointment in 3 months.

## 2021-07-14 NOTE — Telephone Encounter (Signed)
-----   Message from Jettie Booze, MD sent at 07/11/2021  5:39 PM EST ----- No evidence of ischemia, but EF is lower than what echo showed in 2020.  WOuld check echo to eval LVEF.  If the echo shows a lower EF, then will have to change some meds.  If LVEF normal, then would continue current therapy.

## 2021-07-14 NOTE — Telephone Encounter (Signed)
Left message to call office

## 2021-07-29 ENCOUNTER — Other Ambulatory Visit: Payer: Self-pay

## 2021-07-29 ENCOUNTER — Ambulatory Visit (HOSPITAL_COMMUNITY): Payer: Medicare Other | Attending: Interventional Cardiology

## 2021-07-29 DIAGNOSIS — I25118 Atherosclerotic heart disease of native coronary artery with other forms of angina pectoris: Secondary | ICD-10-CM | POA: Insufficient documentation

## 2021-07-29 DIAGNOSIS — R0609 Other forms of dyspnea: Secondary | ICD-10-CM | POA: Diagnosis not present

## 2021-07-29 LAB — ECHOCARDIOGRAM COMPLETE
Area-P 1/2: 4.31 cm2
MV M vel: 4.87 m/s
MV Peak grad: 94.7 mmHg
Radius: 0.8 cm
S' Lateral: 2.7 cm

## 2021-08-04 LAB — HM DIABETES EYE EXAM

## 2021-08-07 ENCOUNTER — Other Ambulatory Visit: Payer: Self-pay | Admitting: Family

## 2021-08-07 DIAGNOSIS — N644 Mastodynia: Secondary | ICD-10-CM

## 2021-08-20 ENCOUNTER — Ambulatory Visit
Admission: RE | Admit: 2021-08-20 | Discharge: 2021-08-20 | Disposition: A | Discharge status: Home / Self Care | Payer: Medicare Other | Source: Ambulatory Visit | Attending: Family | Admitting: Family

## 2021-08-20 ENCOUNTER — Ambulatory Visit: Payer: Medicare Other

## 2021-08-20 DIAGNOSIS — N644 Mastodynia: Secondary | ICD-10-CM

## 2021-09-08 ENCOUNTER — Encounter: Payer: Medicare Other | Attending: Endocrinology | Admitting: Dietician

## 2021-09-29 ENCOUNTER — Other Ambulatory Visit: Payer: Medicare Other

## 2021-09-29 ENCOUNTER — Other Ambulatory Visit: Payer: Self-pay | Admitting: Endocrinology

## 2021-10-13 ENCOUNTER — Ambulatory Visit: Payer: Medicare Other | Admitting: Endocrinology

## 2021-10-13 VITALS — BP 106/76 | HR 78 | Ht 65.0 in | Wt 246.4 lb

## 2021-10-13 DIAGNOSIS — N184 Chronic kidney disease, stage 4 (severe): Secondary | ICD-10-CM | POA: Diagnosis not present

## 2021-10-13 DIAGNOSIS — E1122 Type 2 diabetes mellitus with diabetic chronic kidney disease: Secondary | ICD-10-CM

## 2021-10-13 LAB — POCT GLYCOSYLATED HEMOGLOBIN (HGB A1C): Hemoglobin A1C: 7 % — AB (ref 4.0–5.6)

## 2021-10-13 MED ORDER — REPAGLINIDE 1 MG PO TABS
1.0000 mg | ORAL_TABLET | Freq: Two times a day (BID) | ORAL | 1 refills | Status: DC
Start: 2021-10-13 — End: 2023-10-27

## 2021-10-13 NOTE — Patient Instructions (Addendum)
check your blood sugar once a day.  vary the time of day when you check, between before the 3 meals, and at bedtime.  also check if you have symptoms of your blood sugar being too high or too low.  please keep a record of the readings and bring it to your next appointment here (or you can bring the meter itself).  You can write it on any piece of paper.  please call us sooner if your blood sugar goes below 70, or if you have a lot of readings over 200.   ?I have sent a prescription to your pharmacy, to change to glipizide to repaglinide.   ?Please continue the same Rybelsus.   ?Please come back for a follow-up appointment in 3 months.  ?

## 2021-10-13 NOTE — Progress Notes (Signed)
? ?Subjective:  ? ? Patient ID: Diane Steele, female    DOB: May 04, 1961, 61 y.o.   MRN: [id number] ? ?HPI ?Pt returns for f/u of diabetes mellitus: ?DM type: 2 ?Dx'ed: 2000 ?Complications: CAD, PN and stage 4 CRI ?Therapy: 2 oral meds. ?GDM: never ?DKA: never ?Severe hypoglycemia: never ?Pancreatitis: never ?Pancreatic imaging: normal on 2020 CT.   ?SDOH: Pt says she could not give herself insulin.   ?Other: pt states she feels well in general.  She takes meds as rx'ed.  no cbg record, but states cbg's vary from 80-190.  She has intermitt leg swelling.   ?Past Medical History:  ?Diagnosis Date  ? A-fib ([place])   ? Anginal pain ([place])   ? Anxiety   ? Aortic atherosclerosis ([place])   ? Arthritis   ? Back pain   ? Bradycardia   ? CAD (coronary artery disease)   ? Caregiver stress   ? CHF (congestive heart failure) ([place])   ? CKD (chronic kidney disease)   ? Coagulation defect ([place])   ? Coronary artery disease   ? Depression   ? Diabetes mellitus without complication ([place])   ? GERD (gastroesophageal reflux disease)   ? Hair loss   ? High cholesterol   ? History of DVT (deep vein thrombosis)   ? History of tobacco use   ? Hypertension   ? Insomnia   ? Long term current use of oral hypoglycemic drug   ? Lower leg edema   ? Memory loss   ? Morbid obesity ([place])   ? Myocardial infarction [facility])   ? " MILD "  IN 1997  ? Nausea and vomiting   ? Neuromuscular disorder ([place])   ? DIABETIC NEUROPATHY  ? OSA (obstructive sleep apnea)   ? Periumbilical abdominal pain   ? Shortness of breath   ? Type 2 diabetes mellitus with both eyes affected by mild nonproliferative retinopathy without macular edema, without long-term current use of insulin ([place])   ? Type 2 diabetes mellitus with microalbuminuria (HCC)   ? Type 2 diabetes mellitus with stage 4 chronic kidney disease ([place])   ? Vitamin D deficiency   ? ? ?Past Surgical History:  ?Procedure Laterality Date  ? CHOLECYSTECTOMY    ? CORONARY ARTERY BYPASS GRAFT    ? CORONARY STENT PLACEMENT     ? CYST EXCISION    ? CHEST & WRIST  ? ? ?Social History  ? ?Socioeconomic History  ? Marital status: Married  ?  Spouse name: Not on file  ? Number of children: Not on file  ? Years of education: Not on file  ? Highest education level: Not on file  ?Occupational History  ? Not on file  ?Tobacco Use  ? Smoking status: Former  ?  Types: Cigarettes  ?  Start date: 01/25/2002  ? Smokeless tobacco: Never  ?Vaping Use  ? Vaping Use: Never used  ?Substance and Sexual Activity  ? Alcohol use: Yes  ?  Comment: SOCIAL  ? Drug use: No  ? Sexual activity: Not on file  ?Other Topics Concern  ? Not on file  ?Social History Narrative  ? Not on file  ? ?Social Determinants of Health  ? ?Financial Resource Strain: Not on file  ?Food Insecurity: Not on file  ?Transportation Needs: Not on file  ?Physical Activity: Not on file  ?Stress: Not on file  ?Social Connections: Not on file  ?Intimate Partner Violence: Not on file  ? ? ?Current Outpatient  Medications on File Prior to Visit  ?Medication Sig Dispense Refill  ? amLODipine (NORVASC) 10 MG tablet Take 10 mg by mouth daily.    ? apixaban (ELIQUIS) 5 MG TABS tablet Eliquis 5 mg tablet    ? Blood Glucose Monitoring Suppl (ONETOUCH VERIO) w/Device KIT Use 1-4 times daily as needed/directed DX E11.9 1 kit 0  ? Cholecalciferol (VITAMIN D3) 1.25 MG (50000 UT) CAPS Take 1 capsule by mouth once a week.    ? furosemide (LASIX) 20 MG tablet Take 40 mg by mouth 2 (two) times daily.    ? glucose blood (ONETOUCH VERIO) test strip 1 each by Other route 2 (two) times daily. And lancets 2/day 200 strip 3  ? isosorbide mononitrate (IMDUR) 60 MG 24 hr tablet Take 60 mg by mouth daily.    ? Lancets (ONETOUCH ULTRASOFT) lancets Use 1-4 times daily as needed/directed  DX E11.9 200 each 12  ? levothyroxine (SYNTHROID) 75 MCG tablet Take 75 mcg by mouth daily.    ? metoprolol tartrate (LOPRESSOR) 25 MG tablet Take 1 tablet (25 mg total) by mouth 2 (two) times daily. (Patient taking differently: Take 12.5 mg  by mouth 2 (two) times daily.) 60 tablet 3  ? Multiple Vitamin (MULTI-VITAMIN DAILY PO) Take 1 tablet by mouth daily.    ? nitroGLYCERIN (NITROSTAT) 0.4 MG SL tablet Place 0.4 mg under the tongue every 5 (five) minutes as needed for chest pain.    ? pantoprazole (PROTONIX) 40 MG tablet Take 40 mg by mouth daily.    ? potassium chloride (K-DUR) 10 MEQ tablet Take 10 mEq by mouth daily.    ? rosuvastatin (CRESTOR) 40 MG tablet Take 1 tablet (40 mg total) by mouth daily. 90 tablet 3  ? Semaglutide (RYBELSUS) 14 MG TABS Take 14 mg by mouth daily. 90 tablet 3  ? ULTRAM 50 MG tablet Take 50 mg by mouth every 6 (six) hours.    ? ?No current facility-administered medications on file prior to visit.  ? ? ?No Known Allergies ? ?Family History  ?Problem Relation Age of Onset  ? Diabetes Mother   ? Diabetes Father   ? Breast cancer Sister 83  ? Diabetes Sister   ? Breast cancer Sister 19  ? Diabetes Sister   ? Diabetes Sister   ? Diabetes Sister   ? High blood pressure Daughter   ? Breast cancer Maternal Aunt 45  ? Heart attack Brother   ? ? ?BP 106/76 (BP Location: Left Arm, Patient Position: Sitting, Cuff Size: Normal)   Pulse 78   Ht '5\' 5"'  (1.651 m)   Wt 246 lb 6.4 oz (111.8 kg)   SpO2 98%   BMI 41.00 kg/m?  ? ? ?Review of Systems ? ?   ?Objective:  ? Physical Exam ?VITAL SIGNS:  See vs page.   ?GENERAL: no distress.   ?EXT: no leg edema.   ? ? ? ?A1c=7.0% ? ?   ?Assessment & Plan:  ?Type 2 DM: uncontrolled ?Renal failure: we discuss need to avoid glipizide ? ?Patient Instructions  ?check your blood sugar once a day.  vary the time of day when you check, between before the 3 meals, and at bedtime.  also check if you have symptoms of your blood sugar being too high or too low.  please keep a record of the readings and bring it to your next appointment here (or you can bring the meter itself).  You can write it on any piece of paper.  please call us sooner if your blood sugar goes below 70, or if you have a lot of  readings over 200.   ?I have sent a prescription to your pharmacy, to change to glipizide to repaglinide.   ?Please continue the same Rybelsus.   ?Please come back for a follow-up appointment in 3 months.  ? ? ?

## 2021-10-20 ENCOUNTER — Ambulatory Visit: Payer: Medicare Other | Admitting: Skilled Nursing Facility1

## 2021-10-20 NOTE — Progress Notes (Signed)
?  ?Cardiology Office Note ? ? ?Date:  10/21/2021  ? ?ID:  Diane Steele, DOB 1960/09/12, MRN [id number] ? ?PCP:  Sonia Side., FNP  ? ? ?No chief complaint on file. ? ?CAD ? ?Wt Readings from Last 3 Encounters:  ?10/21/21 253 lb (114.8 kg)  ?10/13/21 246 lb 6.4 oz (111.8 kg)  ?07/14/21 242 lb 12.8 oz (110.1 kg)  ?  ? ?  ?History of Present Illness: ?Diane Steele is a 61 y.o. female  with family h/o early CAD.  Mother with CABG at 44. Brother died from MI at 69. ?  ?Patient has a history of hypertension, heart failure and coronary artery disease.  She had seen Dr. Terrence Dupont in the past but wanted to transfer care to [facility] heart care. ?  ?Review of the record shows she has a history of CABG at age 80, in 80  (LIMA to LAD, SVG to RCA, SVG to first diagonal, SVG to second obtuse marginal) and multiple PCI in the past.  In 1997 she had PTCA of the saphenous vein graft to the RCA.  In 2004, she underwent catheterization.  She had 3.5 x 24 Taxus stent placement to the SVG to the first diagonal.  Later in 2004, she had stent placement from the proximal to distal circumflex.  There were 2 long overlapping stents, 3.75 x 33 Cypher and 3.0 x 38 stent. ?  ?Last In 2005 showed severe three-vessel disease.  Patent stents in the circumflex.  Patent SVG to OM 2.  Patent SVG to diagonal.  Patent SVG to distal RCA but there was concern that the distal graft was degenerated and there was only TIMI II flow.  This was not suitable for any intervention per the report. ?  ?She was hospitalized in 2014.  She had paroxysmal atrial fibrillation at that time.  She was started on amiodarone.  She was started on apixaban.  She converted to normal sinus rhythm.  She also had a negative nuclear study at that time. ?  ?2020 echo: ?"The left ventricle has normal systolic function with an ejection  ?fraction of 60-65%. The cavity size was normal. Left ventricular diastolic  ?Doppler parameters are consistent with pseudonormalization.  ? 2. The  right ventricle has normal systolic function. The cavity was  ?normal. There is no increase in right ventricular wall thickness.  ? 3. Right atrial size was mildly dilated.  ? 4. The mitral valve is normal in structure.  ? 5. The tricuspid valve is normal in structure.  ? 6. The aortic valve is normal in structure.  ? 7. The aortic root and ascending aorta are normal in size and structure.  ? 8. The interatrial septum was not assessed. " ? ?Stress test in Jan 2023 showed: "No evidence of ischemia, but EF is lower than what echo showed in 2020.  WOuld check echo to eval LVEF.  If the echo shows a lower EF, then will have to change some meds.  If LVEF normal, then would continue current therapy." ? ?Echo in 2023 showed: "Normal LV/RV function. Mild to mod MR. Dilated atria. No evidence of wall motion abnormalities. COntinue to try to build stamina gradually.  ? ?She has felt well.  She walks 3x/week for 30 minutes.  Feels well ? ? ?Past Medical History:  ?Diagnosis Date  ? A-fib ([place])   ? Anginal pain ([place])   ? Anxiety   ? Aortic atherosclerosis ([place])   ? Arthritis   ?  Back pain   ? Bradycardia   ? CAD (coronary artery disease)   ? Caregiver stress   ? CHF (congestive heart failure) ([place])   ? CKD (chronic kidney disease)   ? Coagulation defect ([place])   ? Coronary artery disease   ? Depression   ? Diabetes mellitus without complication ([place])   ? GERD (gastroesophageal reflux disease)   ? Hair loss   ? High cholesterol   ? History of DVT (deep vein thrombosis)   ? History of tobacco use   ? Hypertension   ? Insomnia   ? Long term current use of oral hypoglycemic drug   ? Lower leg edema   ? Memory loss   ? Morbid obesity ([place])   ? Myocardial infarction [facility])   ? " MILD "  IN 1997  ? Nausea and vomiting   ? Neuromuscular disorder ([place])   ? DIABETIC NEUROPATHY  ? OSA (obstructive sleep apnea)   ? Periumbilical abdominal pain   ? Shortness of breath   ? Type 2 diabetes mellitus with both eyes affected by mild  nonproliferative retinopathy without macular edema, without long-term current use of insulin ([place])   ? Type 2 diabetes mellitus with microalbuminuria (HCC)   ? Type 2 diabetes mellitus with stage 4 chronic kidney disease ([place])   ? Vitamin D deficiency   ? ? ?Past Surgical History:  ?Procedure Laterality Date  ? CHOLECYSTECTOMY    ? CORONARY ARTERY BYPASS GRAFT    ? CORONARY STENT PLACEMENT    ? CYST EXCISION    ? CHEST & WRIST  ? ? ? ?Current Outpatient Medications  ?Medication Sig Dispense Refill  ? amLODipine (NORVASC) 10 MG tablet Take 10 mg by mouth daily.    ? apixaban (ELIQUIS) 5 MG TABS tablet Eliquis 5 mg tablet    ? Blood Glucose Monitoring Suppl (ONETOUCH VERIO) w/Device KIT Use 1-4 times daily as needed/directed DX E11.9 1 kit 0  ? Cholecalciferol (VITAMIN D3) 1.25 MG (50000 UT) CAPS Take 1 capsule by mouth once a week.    ? furosemide (LASIX) 20 MG tablet Take 40 mg by mouth 2 (two) times daily.    ? glucose blood (ONETOUCH VERIO) test strip 1 each by Other route 2 (two) times daily. And lancets 2/day 200 strip 3  ? isosorbide mononitrate (IMDUR) 60 MG 24 hr tablet Take 60 mg by mouth daily.    ? Lancets (ONETOUCH ULTRASOFT) lancets Use 1-4 times daily as needed/directed  DX E11.9 200 each 12  ? levothyroxine (SYNTHROID) 75 MCG tablet Take 75 mcg by mouth daily.    ? metoprolol tartrate (LOPRESSOR) 25 MG tablet Take 1 tablet (25 mg total) by mouth 2 (two) times daily. (Patient taking differently: Take 12.5 mg by mouth 2 (two) times daily.) 60 tablet 3  ? Multiple Vitamin (MULTI-VITAMIN DAILY PO) Take 1 tablet by mouth daily.    ? nitroGLYCERIN (NITROSTAT) 0.4 MG SL tablet Place 0.4 mg under the tongue every 5 (five) minutes as needed for chest pain.    ? pantoprazole (PROTONIX) 40 MG tablet Take 40 mg by mouth daily.    ? potassium chloride (K-DUR) 10 MEQ tablet Take 10 mEq by mouth daily.    ? repaglinide (PRANDIN) 1 MG tablet Take 1 tablet (1 mg total) by mouth 2 (two) times daily before a meal. 180  tablet 1  ? rosuvastatin (CRESTOR) 40 MG tablet Take 1 tablet (40 mg total) by mouth daily. 90 tablet 3  ?  Semaglutide (RYBELSUS) 14 MG TABS Take 14 mg by mouth daily. 90 tablet 3  ? ULTRAM 50 MG tablet Take 50 mg by mouth every 6 (six) hours.    ? ?No current facility-administered medications for this visit.  ? ? ?Allergies:   Patient has no known allergies.  ? ? ?Social History:  The patient  reports that she has quit smoking. Her smoking use included cigarettes. She started smoking about 19 years ago. She has never used smokeless tobacco. She reports current alcohol use. She reports that she does not use drugs.  ? ?Family History:  The patient's family history includes Breast cancer (age of onset: 55) in her sister; Breast cancer (age of onset: 25) in her maternal aunt; Breast cancer (age of onset: 36) in her sister; Diabetes in her father, mother, sister, sister, sister, and sister; Heart attack in her brother; High blood pressure in her daughter.  ? ? ?ROS:  Please see the history of present illness.   Otherwise, review of systems are positive for intentional weigh tloss over the past 6 months.   All other systems are reviewed and negative.  ? ? ?PHYSICAL EXAM: ?VS:  BP 114/72   Pulse 70   Ht '5\' 5"'  (1.651 m)   Wt 253 lb (114.8 kg)   SpO2 99%   BMI 42.10 kg/m?  , BMI Body mass index is 42.1 kg/m?. ?GEN: Well nourished, well developed, in no acute distress ?HEENT: normal ?Neck: no JVD, carotid bruits, or masses ?Cardiac: RRR; no murmurs, rubs, or gallops,no edema  ?Respiratory:  clear to auscultation bilaterally, normal work of breathing ?GI: soft, nontender, nondistended, + BS ?MS: no deformity or atrophy ?Skin: warm and dry, no rash ?Neuro:  Strength and sensation are intact ?Psych: euthymic mood, full affect ? ? ? ?Recent Labs: ?No results found for requested labs within last 8760 hours.  ? ?Lipid Panel ?No results found for: CHOL, TRIG, HDL, CHOLHDL, VLDL, LDLCALC, LDLDIRECT ?  ?Other studies  Reviewed: ?Additional studies/ records that were reviewed today with results demonstrating: labs reviewed and noted below. ? ? ?ASSESSMENT AND PLAN: ? ?CAD: No angina on medical therapy. Continue aggressive secondary prevention.  R

## 2021-10-21 ENCOUNTER — Encounter: Payer: Self-pay | Admitting: Interventional Cardiology

## 2021-10-21 ENCOUNTER — Ambulatory Visit: Payer: Medicare Other | Admitting: Interventional Cardiology

## 2021-10-21 VITALS — BP 114/72 | HR 70 | Ht 65.0 in | Wt 253.0 lb

## 2021-10-21 DIAGNOSIS — I25118 Atherosclerotic heart disease of native coronary artery with other forms of angina pectoris: Secondary | ICD-10-CM | POA: Diagnosis not present

## 2021-10-21 DIAGNOSIS — E1159 Type 2 diabetes mellitus with other circulatory complications: Secondary | ICD-10-CM

## 2021-10-21 DIAGNOSIS — E782 Mixed hyperlipidemia: Secondary | ICD-10-CM | POA: Diagnosis not present

## 2021-10-21 DIAGNOSIS — I10 Essential (primary) hypertension: Secondary | ICD-10-CM

## 2021-10-21 DIAGNOSIS — R0609 Other forms of dyspnea: Secondary | ICD-10-CM

## 2021-10-21 NOTE — Patient Instructions (Signed)
Medication Instructions:  ?Your physician recommends that you continue on your current medications as directed. Please refer to the Current Medication list given to you today. ? ?*If you need a refill on your cardiac medications before your next appointment, please call your pharmacy* ? ? ?Lab Work: ?Your physician recommends that you return for lab work on Oct 27, 2021.  CBC, CMET and Lipids.  This will be fasting.  The lab opens at 7:15 AM ? ?If you have labs (blood work) drawn today and your tests are completely normal, you will receive your results only by: ?MyChart Message (if you have MyChart) OR ?A paper copy in the mail ?If you have any lab test that is abnormal or we need to change your treatment, we will call you to review the results. ? ? ?Testing/Procedures: ?none ? ? ?Follow-Up: ?At [facility], you and your health needs are our priority.  As part of our continuing mission to provide you with exceptional heart care, we have created designated Provider Care Teams.  These Care Teams include your primary Cardiologist (physician) and Advanced Practice Providers (APPs -  Physician Assistants and Nurse Practitioners) who all work together to provide you with the care you need, when you need it. ? ?We recommend signing up for the patient portal called "MyChart".  Sign up information is provided on this After Visit Summary.  MyChart is used to connect with patients for Virtual Visits (Telemedicine).  Patients are able to view lab/test results, encounter notes, upcoming appointments, etc.  Non-urgent messages can be sent to your provider as well.   ?To learn more about what you can do with MyChart, go to [email or web address].   ? ?Your next appointment:   ?12 month(s) ? ?The format for your next appointment:   ?In Person ? ?Provider:   ?Larae Grooms, MD   ? ? ?Other Instructions ?High-Fiber Eating Plan ?Fiber, also called dietary fiber, is a type of carbohydrate. It is found foods such as fruits,  vegetables, whole grains, and beans. A high-fiber diet can have many health benefits. Your health care provider may recommend a high-fiber diet to help: ?Prevent constipation. Fiber can make your bowel movements more regular. ?Lower your cholesterol. ?Relieve the following conditions: ?Inflammation of veins in the anus (hemorrhoids). ?Inflammation of specific areas of the digestive tract (uncomplicated diverticulosis). ?A problem of the large intestine, also called the colon, that sometimes causes pain and diarrhea (irritable bowel syndrome, or IBS). ?Prevent overeating as part of a weight-loss plan. ?Prevent heart disease, type 2 diabetes, and certain cancers. ?What are tips for following this plan? ?Reading food labels ? ?Check the nutrition facts label on food products for the amount of dietary fiber. Choose foods that have 5 grams of fiber or more per serving. ?The goals for recommended daily fiber intake include: ?Men (age 56 or younger): 34-38 g. ?Men (over age 28): 28-34 g. ?Women (age 2 or younger): 25-28 g. ?Women (over age 76): 22-25 g. ?Your daily fiber goal is _____________ g. ?Shopping ?Choose whole fruits and vegetables instead of processed forms, such as apple juice or applesauce. ?Choose a wide variety of high-fiber foods such as avocados, lentils, oats, and kidney beans. ?Read the nutrition facts label of the foods you choose. Be aware of foods with added fiber. These foods often have high sugar and sodium amounts per serving. ?Cooking ?Use whole-grain flour for baking and cooking. ?Cook with brown rice instead of white rice. ?Meal planning ?Start the day with a breakfast that is  high in fiber, such as a cereal that contains 5 g of fiber or more per serving. ?Eat breads and cereals that are made with whole-grain flour instead of refined flour or white flour. ?Eat brown rice, bulgur wheat, or millet instead of white rice. ?Use beans in place of meat in soups, salads, and pasta dishes. ?Be sure that  half of the grains you eat each day are whole grains. ?General information ?You can get the recommended daily intake of dietary fiber by: ?Eating a variety of fruits, vegetables, grains, nuts, and beans. ?Taking a fiber supplement if you are not able to take in enough fiber in your diet. It is better to get fiber through food than from a supplement. ?Gradually increase how much fiber you consume. If you increase your intake of dietary fiber too quickly, you may have bloating, cramping, or gas. ?Drink plenty of water to help you digest fiber. ?Choose high-fiber snacks, such as berries, raw vegetables, nuts, and popcorn. ?What foods should I eat? ?Fruits ?Berries. Pears. Apples. Oranges. Avocado. Prunes and raisins. Dried figs. ?Vegetables ?Sweet potatoes. Spinach. Kale. Artichokes. Cabbage. Broccoli. Cauliflower. Green peas. Carrots. Squash. ?Grains ?Whole-grain breads. Multigrain cereal. Oats and oatmeal. Brown rice. Barley. Bulgur wheat. [place]. Quinoa. Bran muffins. Popcorn. Rye wafer crackers. ?Meats and other proteins ?Navy beans, kidney beans, and pinto beans. Soybeans. Split peas. Lentils. Nuts and seeds. ?Dairy ?Fiber-fortified yogurt. ?Beverages ?Fiber-fortified soy milk. Fiber-fortified orange juice. ?Other foods ?Fiber bars. ?The items listed above may not be a complete list of recommended foods and beverages. Contact a dietitian for more information. ?What foods should I avoid? ?Fruits ?Fruit juice. Cooked, strained fruit. ?Vegetables ?Fried potatoes. Canned vegetables. Well-cooked vegetables. ?Grains ?White bread. Pasta made with refined flour. White rice. ?Meats and other proteins ?Fatty cuts of meat. Fried chicken or fried fish. ?Dairy ?Milk. Yogurt. Cream cheese. Sour cream. ?Fats and oils ?Butters. ?Beverages ?Soft drinks. ?Other foods ?Cakes and pastries. ?The items listed above may not be a complete list of foods and beverages to avoid. Talk with your dietitian about what choices are best for  you. ?Summary ?Fiber is a type of carbohydrate. It is found in foods such as fruits, vegetables, whole grains, and beans. ?A high-fiber diet has many benefits. It can help to prevent constipation, lower blood cholesterol, aid weight loss, and reduce your risk of heart disease, diabetes, and certain cancers. ?Increase your intake of fiber gradually. Increasing fiber too quickly may cause cramping, bloating, and gas. Drink plenty of water while you increase the amount of fiber you consume. ?The best sources of fiber include whole fruits and vegetables, whole grains, nuts, seeds, and beans. ?This information is not intended to replace advice given to you by your health care provider. Make sure you discuss any questions you have with your health care provider. ?Document Revised: 10/12/2019 Document Reviewed: 10/12/2019 ?Elsevier Patient Education ? [place]. ? ? ?Important Information About Sugar ? ? ? ? ? ? ?

## 2021-10-27 ENCOUNTER — Other Ambulatory Visit: Payer: Medicare Other | Admitting: *Deleted

## 2021-10-27 DIAGNOSIS — I10 Essential (primary) hypertension: Secondary | ICD-10-CM

## 2021-10-27 DIAGNOSIS — E1159 Type 2 diabetes mellitus with other circulatory complications: Secondary | ICD-10-CM

## 2021-10-27 DIAGNOSIS — I25118 Atherosclerotic heart disease of native coronary artery with other forms of angina pectoris: Secondary | ICD-10-CM

## 2021-10-27 DIAGNOSIS — E782 Mixed hyperlipidemia: Secondary | ICD-10-CM

## 2021-10-27 DIAGNOSIS — R0609 Other forms of dyspnea: Secondary | ICD-10-CM

## 2021-10-27 LAB — CBC
Hematocrit: 34.2 % (ref 34.0–46.6)
Hemoglobin: 11.1 g/dL (ref 11.1–15.9)
MCH: 27.5 pg (ref 26.6–33.0)
MCHC: 32.5 g/dL (ref 31.5–35.7)
MCV: 85 fL (ref 79–97)
Platelets: 224 10*3/uL (ref 150–450)
RBC: 4.03 x10E6/uL (ref 3.77–5.28)
RDW: 15.1 % (ref 11.7–15.4)
WBC: 6.3 10*3/uL (ref 3.4–10.8)

## 2021-10-27 LAB — COMPREHENSIVE METABOLIC PANEL
ALT: 17 IU/L (ref 0–32)
AST: 15 IU/L (ref 0–40)
Albumin/Globulin Ratio: 1.7 (ref 1.2–2.2)
Albumin: 4.5 g/dL (ref 3.8–4.9)
Alkaline Phosphatase: 81 IU/L (ref 44–121)
BUN/Creatinine Ratio: 28 (ref 12–28)
BUN: 65 mg/dL — ABNORMAL HIGH (ref 8–27)
Bilirubin Total: 0.5 mg/dL (ref 0.0–1.2)
CO2: 16 mmol/L — ABNORMAL LOW (ref 20–29)
Calcium: 9.8 mg/dL (ref 8.7–10.3)
Chloride: 106 mmol/L (ref 96–106)
Creatinine, Ser: 2.33 mg/dL — ABNORMAL HIGH (ref 0.57–1.00)
Globulin, Total: 2.7 g/dL (ref 1.5–4.5)
Glucose: 137 mg/dL — ABNORMAL HIGH (ref 70–99)
Potassium: 4.6 mmol/L (ref 3.5–5.2)
Sodium: 139 mmol/L (ref 134–144)
Total Protein: 7.2 g/dL (ref 6.0–8.5)
eGFR: 23 mL/min/{1.73_m2} — ABNORMAL LOW (ref >59)

## 2021-10-27 LAB — LIPID PANEL
Chol/HDL Ratio: 2.5 ratio (ref 0.0–4.4)
Cholesterol, Total: 152 mg/dL (ref 100–199)
HDL: 60 mg/dL (ref >39)
LDL Chol Calc (NIH): 79 mg/dL (ref 0–99)
Triglycerides: 64 mg/dL (ref 0–149)
VLDL Cholesterol Cal: 13 mg/dL (ref 5–40)

## 2021-10-30 ENCOUNTER — Telehealth: Payer: Self-pay | Admitting: Interventional Cardiology

## 2021-10-30 NOTE — Telephone Encounter (Signed)
Patient is returning RN's call. Transferred to RN.  ?

## 2021-10-30 NOTE — Telephone Encounter (Signed)
I spoke with patient and reviewed lab results/recommendations with her ?

## 2022-01-19 ENCOUNTER — Ambulatory Visit: Payer: Medicare Other | Admitting: Internal Medicine

## 2022-02-19 ENCOUNTER — Ambulatory Visit: Payer: Medicare Other | Admitting: Internal Medicine

## 2022-02-19 NOTE — Progress Notes (Deleted)
Patient ID: Diane Steele, female   DOB: 1960-10-02, 61 y.o.   MRN: [id number]  HPI: Diane Steele is a 61 y.o.-year-old female, returning for follow-up for DM2, dx in 2000, non-insulin-dependent, uncontrolled, with complications. Pt. previously saw Dr. Loanne Drilling, last visit ***.  Reviewed HbA1c: Lab Results  Component Value Date   HGBA1C 7.0 (A) 10/13/2021   HGBA1C 7.9 (A) 07/14/2021   HGBA1C 7.4 (A) 04/08/2021   HGBA1C 6.5 (A) 01/06/2021   HGBA1C 9.2 (A) 04/05/2020   HGBA1C 10.4 01/22/2020   HGBA1C 13.8 (H) 01/13/2013   Pt is on a regimen of: - Prandin 1 mg twice a day - Rybelsus 14 mg in the a.m. Per review of Dr. Cordelia Pen note, she could not give herself insulin before.  Pt checks her sugars *** a day and they are: - am: n/c - 2h after b'fast: n/c - before lunch: n/c - 2h after lunch: n/c - before dinner: n/c - 2h after dinner: n/c - bedtime: n/c - nighttime: n/c Lowest sugar was ***; she has hypoglycemia awareness at 70.  Highest sugar was ***.  Glucometer: One Touch Verio  Pt's meals are: - Breakfast: - Lunch: - Dinner: - Snacks:  - no CKD, last BUN/creatinine:  Lab Results  Component Value Date   BUN 65 (H) 10/27/2021   BUN 48 (A) 01/22/2020   CREATININE 2.33 (H) 10/27/2021   CREATININE 2.2 (A) 01/22/2020  She is not on ACE inhibitor/ARB.  -+ HL; last set of lipids: Lab Results  Component Value Date   CHOL 152 10/27/2021   HDL 60 10/27/2021   LDLCALC 79 10/27/2021   TRIG 64 10/27/2021   CHOLHDL 2.5 10/27/2021  On Crestor 40 mg daily.  - last eye exam was in 07/2021. + DR.   - no numbness and tingling in her feet.  Foot exam 03/2022.  She also has a history of hypothyroidism:  She is on levothyroxine 75 mcg daily: - in am - fasting - at least 30 min from b'fast - no calcium - no iron - no multivitamins - no PPIs - not on Biotin  Reviewed her TSH levels: 01/10/2021: TSH 0.18 (0.4-4.5) Lab Results  Component Value Date   TSH 2.205  01/13/2013   She also has a history of HTN, anxiety/depression, DVT, GERD.  ROS: + see HPI No increased urination, blurry vision, nausea, chest pain.  Past Medical History:  Diagnosis Date   A-fib ([place])    Anginal pain ([place])    Anxiety    Aortic atherosclerosis ([facility])    Arthritis    Back pain    Bradycardia    CAD (coronary artery disease)    Caregiver stress    CHF (congestive heart failure) (HCC)    CKD (chronic kidney disease)    Coagulation defect (HCC)    Coronary artery disease    Depression    Diabetes mellitus without complication (HCC)    GERD (gastroesophageal reflux disease)    Hair loss    High cholesterol    History of DVT (deep vein thrombosis)    History of tobacco use    Hypertension    Insomnia    Long term current use of oral hypoglycemic drug    Lower leg edema    Memory loss    Morbid obesity ([place])    Myocardial infarction ([place])    " MILD "  IN 1997   Nausea and vomiting    Neuromuscular disorder (HCC)    DIABETIC NEUROPATHY  OSA (obstructive sleep apnea)    Periumbilical abdominal pain    Shortness of breath    Type 2 diabetes mellitus with both eyes affected by mild nonproliferative retinopathy without macular edema, without long-term current use of insulin (HCC)    Type 2 diabetes mellitus with microalbuminuria (HCC)    Type 2 diabetes mellitus with stage 4 chronic kidney disease (HCC)    Vitamin D deficiency    Past Surgical History:  Procedure Laterality Date   CHOLECYSTECTOMY     CORONARY ARTERY BYPASS GRAFT     CORONARY STENT PLACEMENT     CYST EXCISION     CHEST & WRIST   Social History   Socioeconomic History   Marital status: Married    Spouse name: Not on file   Number of children: Not on file   Years of education: Not on file   Highest education level: Not on file  Occupational History   Not on file  Tobacco Use   Smoking status: Former    Types: Cigarettes    Start date: 01/25/2002   Smokeless tobacco: Never   Vaping Use   Vaping Use: Never used  Substance and Sexual Activity   Alcohol use: Yes    Comment: SOCIAL   Drug use: No   Sexual activity: Not on file  Other Topics Concern   Not on file  Social History Narrative   Not on file   Social Determinants of Health   Financial Resource Strain: Not on file  Food Insecurity: Not on file  Transportation Needs: Not on file  Physical Activity: Not on file  Stress: Not on file  Social Connections: Not on file  Intimate Partner Violence: Not on file   Current Outpatient Medications on File Prior to Visit  Medication Sig Dispense Refill   amLODipine (NORVASC) 10 MG tablet Take 10 mg by mouth daily.     apixaban (ELIQUIS) 5 MG TABS tablet Eliquis 5 mg tablet     Blood Glucose Monitoring Suppl (ONETOUCH VERIO) w/Device KIT Use 1-4 times daily as needed/directed DX E11.9 1 kit 0   Cholecalciferol (VITAMIN D3) 1.25 MG (50000 UT) CAPS Take 1 capsule by mouth once a week.     furosemide (LASIX) 20 MG tablet Take 40 mg by mouth 2 (two) times daily.     glucose blood (ONETOUCH VERIO) test strip 1 each by Other route 2 (two) times daily. And lancets 2/day 200 strip 3   isosorbide mononitrate (IMDUR) 60 MG 24 hr tablet Take 60 mg by mouth daily.     Lancets (ONETOUCH ULTRASOFT) lancets Use 1-4 times daily as needed/directed  DX E11.9 200 each 12   levothyroxine (SYNTHROID) 75 MCG tablet Take 75 mcg by mouth daily.     metoprolol tartrate (LOPRESSOR) 25 MG tablet Take 1 tablet (25 mg total) by mouth 2 (two) times daily. (Patient taking differently: Take 12.5 mg by mouth 2 (two) times daily.) 60 tablet 3   Multiple Vitamin (MULTI-VITAMIN DAILY PO) Take 1 tablet by mouth daily.     nitroGLYCERIN (NITROSTAT) 0.4 MG SL tablet Place 0.4 mg under the tongue every 5 (five) minutes as needed for chest pain.     pantoprazole (PROTONIX) 40 MG tablet Take 40 mg by mouth daily.     potassium chloride (K-DUR) 10 MEQ tablet Take 10 mEq by mouth daily.      repaglinide (PRANDIN) 1 MG tablet Take 1 tablet (1 mg total) by mouth 2 (two) times daily before a meal.  180 tablet 1   rosuvastatin (CRESTOR) 40 MG tablet Take 1 tablet (40 mg total) by mouth daily. 90 tablet 3   Semaglutide (RYBELSUS) 14 MG TABS Take 14 mg by mouth daily. 90 tablet 3   ULTRAM 50 MG tablet Take 50 mg by mouth every 6 (six) hours.     No current facility-administered medications on file prior to visit.   No Known Allergies Family History  Problem Relation Age of Onset   Diabetes Mother    Diabetes Father    Breast cancer Sister 21   Diabetes Sister    Breast cancer Sister 24   Diabetes Sister    Diabetes Sister    Diabetes Sister    High blood pressure Daughter    Breast cancer Maternal Aunt 2   Heart attack Brother     PE: There were no vitals taken for this visit. Wt Readings from Last 3 Encounters:  10/21/21 253 lb (114.8 kg)  10/13/21 246 lb 6.4 oz (111.8 kg)  07/14/21 242 lb 12.8 oz (110.1 kg)   Constitutional: overweight, in NAD Eyes: no exophthalmos ENT: moist mucous membranes, no thyromegaly, no cervical lymphadenopathy Cardiovascular: RRR, No MRG Respiratory: CTA B Musculoskeletal: no deformities Skin: moist, warm, no rashes Neurological: no tremor with outstretched hands  ASSESSMENT: 1. DM2, non-insulin-dependent, uncontrolled, with long-term complications - CAD, s/p AMI 1997 - A fib - CHF - PN - DR - CKD stage 4  2. HL  3.  Hypothyroidism  PLAN:  1. Patient with long-standing, uncontrolled diabetes, on oral antidiabetic regimen, with still poor control.  Latest HbA1c was lower, though, at 7.0%. - I suggested to:  There are no Patient Instructions on file for this visit. - check sugars at different times of the day - check 1x a day, rotating checks - discussed about CBG targets for treatment: 80-130 mg/dL before meals and <180 mg/dL after meals; target HbA1c <7%. - given sugar log and advised how to fill it and to bring it at next  appt  - given foot care handout  - given instructions for hypoglycemia management "15-15 rule"  - advised for yearly eye exams  - Return to clinic in 3 mo with sugar log   2. HL - Reviewed latest lipid panel from 3 months ago: LDL above our goal of less than 55, the rest the fractions at goal Lab Results  Component Value Date   CHOL 152 10/27/2021   HDL 60 10/27/2021   LDLCALC 79 10/27/2021   TRIG 64 10/27/2021   CHOLHDL 2.5 10/27/2021  - Continues Crestor 40 mg daily without side effects.  3.  Hypothyroidism - latest thyroid labs reviewed with pt. >> TSH was lower than normal - she continues on LT4 75 mcg daily - pt feels good on this dose. - we discussed about taking the thyroid hormone every day, with water, >30 minutes before breakfast, separated by >4 hours from acid reflux medications, calcium, iron, multivitamins. Pt. is taking it correctly. - will check thyroid tests today: TSH and fT4 - If labs are abnormal, she will need to return for repeat TFTs in 1.5 months  Philemon Kingdom, MD PhD [facility] Endocrinology

## 2022-06-11 ENCOUNTER — Ambulatory Visit
Admission: RE | Admit: 2022-06-11 | Discharge: 2022-06-11 | Disposition: A | Discharge status: Home / Self Care | Payer: Medicare Other | Source: Ambulatory Visit | Attending: Family | Admitting: Family

## 2022-06-11 ENCOUNTER — Other Ambulatory Visit: Payer: Self-pay | Admitting: Family

## 2022-06-11 DIAGNOSIS — M25519 Pain in unspecified shoulder: Secondary | ICD-10-CM

## 2022-06-21 ENCOUNTER — Other Ambulatory Visit: Payer: Self-pay | Admitting: Interventional Cardiology

## 2022-09-28 ENCOUNTER — Emergency Department (HOSPITAL_COMMUNITY)
Admission: EM | Admit: 2022-09-28 | Discharge: 2022-09-28 | Disposition: A | Discharge status: Home / Self Care | Payer: Medicare Other | Attending: Emergency Medicine | Admitting: Emergency Medicine

## 2022-09-28 ENCOUNTER — Other Ambulatory Visit: Payer: Self-pay

## 2022-09-28 ENCOUNTER — Emergency Department (HOSPITAL_COMMUNITY): Payer: Medicare Other

## 2022-09-28 DIAGNOSIS — Z7901 Long term (current) use of anticoagulants: Secondary | ICD-10-CM | POA: Insufficient documentation

## 2022-09-28 DIAGNOSIS — R079 Chest pain, unspecified: Secondary | ICD-10-CM | POA: Diagnosis present

## 2022-09-28 DIAGNOSIS — R0602 Shortness of breath: Secondary | ICD-10-CM | POA: Diagnosis not present

## 2022-09-28 DIAGNOSIS — B348 Other viral infections of unspecified site: Secondary | ICD-10-CM

## 2022-09-28 DIAGNOSIS — Z87891 Personal history of nicotine dependence: Secondary | ICD-10-CM | POA: Insufficient documentation

## 2022-09-28 LAB — BASIC METABOLIC PANEL
Anion gap: 12 (ref 5–15)
BUN: 43 mg/dL — ABNORMAL HIGH (ref 8–23)
CO2: 21 mmol/L — ABNORMAL LOW (ref 22–32)
Calcium: 9.5 mg/dL (ref 8.9–10.3)
Chloride: 106 mmol/L (ref 98–111)
Creatinine, Ser: 2.81 mg/dL — ABNORMAL HIGH (ref 0.44–1.00)
GFR, Estimated: 19 mL/min — ABNORMAL LOW (ref >60)
Glucose, Bld: 114 mg/dL — ABNORMAL HIGH (ref 70–99)
Potassium: 3.4 mmol/L — ABNORMAL LOW (ref 3.5–5.1)
Sodium: 139 mmol/L (ref 135–145)

## 2022-09-28 LAB — RESPIRATORY PANEL BY PCR

## 2022-09-28 LAB — CBC
HCT: 32.9 % — ABNORMAL LOW (ref 36.0–46.0)
Hemoglobin: 10 g/dL — ABNORMAL LOW (ref 12.0–15.0)
MCH: 27.3 pg (ref 26.0–34.0)
MCHC: 30.4 g/dL (ref 30.0–36.0)
MCV: 89.9 fL (ref 80.0–100.0)
Platelets: 220 10*3/uL (ref 150–400)
RBC: 3.66 MIL/uL — ABNORMAL LOW (ref 3.87–5.11)
RDW: 15.2 % (ref 11.5–15.5)
WBC: 6.5 10*3/uL (ref 4.0–10.5)
nRBC: 0 % (ref 0.0–0.2)

## 2022-09-28 LAB — TROPONIN I (HIGH SENSITIVITY)
Troponin I (High Sensitivity): 9 ng/L (ref <18)
Troponin I (High Sensitivity): 7 ng/L (ref <18)

## 2022-09-28 LAB — BRAIN NATRIURETIC PEPTIDE: B Natriuretic Peptide: 124 pg/mL — ABNORMAL HIGH (ref 0.0–100.0)

## 2022-09-28 MED ORDER — OXYCODONE HCL 5 MG PO TABS
5.0000 mg | ORAL_TABLET | Freq: Once | ORAL | Status: AC
  Administered 2022-09-28: 5 mg via ORAL
  Filled 2022-09-28: qty 1

## 2022-09-28 MED ORDER — IPRATROPIUM-ALBUTEROL 0.5-2.5 (3) MG/3ML IN SOLN
3.0000 mL | Freq: Once | RESPIRATORY_TRACT | Status: AC
  Administered 2022-09-28: 3 mL via RESPIRATORY_TRACT
  Filled 2022-09-28: qty 3

## 2022-09-28 MED ORDER — ACETAMINOPHEN 500 MG PO TABS
1000.0000 mg | ORAL_TABLET | Freq: Once | ORAL | Status: AC
  Administered 2022-09-28: 1000 mg via ORAL
  Filled 2022-09-28: qty 2

## 2022-09-28 NOTE — ED Notes (Signed)
Pt showing A-Fib on EKG, provider notified. Verified pt has a hx of A-fib. MD is ok to go ahead with discharge.

## 2022-09-28 NOTE — ED Triage Notes (Signed)
Pt presents stating she has been sick with cough x 1 week. Now feels like 'bricks are laying on her chest"  Pt is shob and has headache.  Painful with inspiration.

## 2022-09-28 NOTE — ED Notes (Signed)
Pt transported to CT ?

## 2022-09-28 NOTE — ED Provider Notes (Signed)
[place] [facility] AT [facility] Provider Note   CSN: [id number] Arrival date & time: 09/28/22  1517     History Chief Complaint  Patient presents with   Chest Pain    HPI Diane Steele is a 62 y.o. female presenting for chief complaint of chest pain.  She is a 62 year old female with a extensive medical history.  States that she started having fever cough congestion approximately 8 days ago has had daily cough, is having discomfort with her cough.  It radiates into the right side of her chest.  Extensive medical history including renal disease, DVTs on Eliquis.  She endorses compliance with all of her home medications.  Smoking history but has not smoked in approximately 20 years.  Denies known history of asthma or COPD.  Otherwise ambulatory tolerating p.o. intake..   Patient's recorded medical, surgical, social, medication list and allergies were reviewed in the Snapshot window as part of the initial history.   Review of Systems   Review of Systems  Constitutional:  Positive for fever. Negative for chills.  HENT:  Negative for ear pain and sore throat.   Eyes:  Negative for pain and visual disturbance.  Respiratory:  Positive for cough and shortness of breath.   Cardiovascular:  Negative for chest pain and palpitations.  Gastrointestinal:  Negative for abdominal pain and vomiting.  Genitourinary:  Negative for dysuria and hematuria.  Musculoskeletal:  Negative for arthralgias and back pain.  Skin:  Negative for color change and rash.  Neurological:  Negative for seizures and syncope.  All other systems reviewed and are negative.   Physical Exam Updated Vital Signs BP 116/64   Pulse 88   Temp (!) 97.5 F (36.4 C) (Oral)   Resp 14   SpO2 100%  Physical Exam Vitals and nursing note reviewed.  Constitutional:      General: She is not in acute distress.    Appearance: She is well-developed.  HENT:     Head: Normocephalic and atraumatic.  Eyes:      Conjunctiva/sclera: Conjunctivae normal.  Cardiovascular:     Rate and Rhythm: Normal rate and regular rhythm.     Heart sounds: No murmur heard. Pulmonary:     Effort: Pulmonary effort is normal. No respiratory distress.     Breath sounds: Decreased breath sounds present.  Abdominal:     General: There is no distension.     Palpations: Abdomen is soft.     Tenderness: There is no abdominal tenderness. There is no right CVA tenderness or left CVA tenderness.  Musculoskeletal:        General: No swelling or tenderness. Normal range of motion.     Cervical back: Neck supple.  Skin:    General: Skin is warm and dry.  Neurological:     General: No focal deficit present.     Mental Status: She is alert and oriented to person, place, and time. Mental status is at baseline.     Cranial Nerves: No cranial nerve deficit.      ED Course/ Medical Decision Making/ A&P    Procedures Procedures   Medications Ordered in ED Medications  acetaminophen (TYLENOL) tablet 1,000 mg (1,000 mg Oral Given 09/28/22 1706)  ipratropium-albuterol (DUONEB) 0.5-2.5 (3) MG/3ML nebulizer solution 3 mL (3 mLs Nebulization Given 09/28/22 1706)  oxyCODONE (Oxy IR/ROXICODONE) immediate release tablet 5 mg (5 mg Oral Given 09/28/22 1943)    Medical Decision Making:    RAMYA LINGG is a  62 y.o. female who presented to the ED today with shortness of breath detailed above.    Complete initial physical exam performed, notably the patient  was hemodynamically stable in no acute distress.  Patient placed on continuous telemetry which was reviewed periodically..      Reviewed and confirmed nursing documentation for past medical history, family history, social history.    Initial Assessment:   With the patient's presentation of shortness of breath, most likely diagnosis is nonspecific etiology likely a viral bronchitis given duration of symptoms, presentation. Other diagnoses were considered including (but not  limited to) pulmonary embolism, ACS, heart failure exacerbation, new diagnosis of COPD with exacerbation, pneumonia, pneumothorax. These are considered less likely due to history of present illness and physical exam findings.   This is most consistent with an acute life/limb threatening illness complicated by underlying chronic conditions.  Initial Plan:  Screening labs including CBC and Metabolic panel to evaluate for infectious or metabolic etiology of disease.  CXR to evaluate for structural/infectious intrathoracic pathology.  Ultimately his chest x-ray and ended up nondiagnostic, we will proceed to noncontrasted study of the chest.  Considered performing angiography, however given patient's compliance on Eliquis, description of the symptoms with fever cough congestion leading into the presentation of chest pain today, pulmonary embolism is favored grossly less likely.  In the context of her known kidney disease, further contrast administration is more likely to impose harm rather than provide diagnostic benefit.  Discussed this with the patient who agreed Troponin/EKG to evaluate for cardiac pathology. Viral panel to evaluate for infectious etiology of patient's symptoms Objective evaluation as below reviewed with plan for close reassessment  Initial Study Results:   Laboratory  All laboratory results reviewed without evidence of clinically relevant pathology.    EKG EKG was reviewed independently. Rate, rhythm, axis, intervals all examined and without medically relevant abnormality. ST segments without concerns for elevations.    Radiology  All images reviewed independently. Agree with radiology report at this time.   CT Chest Wo Contrast  Result Date: 09/28/2022 CLINICAL DATA:  Respiratory illness EXAM: CT CHEST WITHOUT CONTRAST TECHNIQUE: Multidetector CT imaging of the chest was performed following the standard protocol without IV contrast. RADIATION DOSE REDUCTION: This exam was  performed according to the departmental dose-optimization program which includes automated exposure control, adjustment of the mA and/or kV according to patient size and/or use of iterative reconstruction technique. COMPARISON:  None Available. FINDINGS: Cardiovascular: Patient is status post median sternotomy and CABG. Dense atheromatous calcifications coronary arteries and aorta. No pericardial effusion. Mild cardiomegaly. Mediastinum/Nodes: No enlarged mediastinal or axillary lymph nodes. Thyroid gland, trachea, and esophagus demonstrate no significant findings. Lungs/Pleura: Minimal basilar scarring or subsegmental atelectasis. No pleural or pericardial effusion. No pneumonia or pulmonary edema. Upper Abdomen: No acute abnormality. Musculoskeletal: No chest wall mass or suspicious bone lesions identified. IMPRESSION: 1. Mild cardiomegaly. 2. Atheromatous calcifications. 3. Otherwise no acute cardiopulmonary process identified. Electronically Signed   By: Layla MawJoshua  Pleasure M.D.   On: 09/28/2022 17:32   DG Chest 2 View  Result Date: 09/28/2022 CLINICAL DATA:  Chest pain EXAM: CHEST - 2 VIEW COMPARISON:  Chest radiograph 07/19/2016 FINDINGS: Stable cardiomegaly status post median sternotomy. Lungs are clear. No pleural effusion or pneumothorax. Thoracic spine degenerative changes. IMPRESSION: No active cardiopulmonary disease. Electronically Signed   By: Annia Beltrew  Davis M.D.   On: 09/28/2022 16:07       Final Assessment and Plan:   Viral panel positive for rhinovirus.  Patient observed  in emergency department for 5 hours.  No other focal pathology detected.  Given well appearance, stability of vital signs, stability of respiratory symptoms, negative objective evaluation including cross-sectional imaging and positive finding of rhinovirus, her cough is likely related to her postviral tussive syndrome.  Recommended close follow-up with PCP, reassessment in the outpatient setting otherwise no acute indication for  further intervention in emergency room.   Disposition:  I have considered need for hospitalization, however, considering all of the above, I believe this patient is stable for discharge at this time.  Patient/family educated about specific return precautions for given chief complaint and symptoms.  Patient/family educated about follow-up with PCP   Patient/family expressed understanding of return precautions and need for follow-up. Patient spoken to regarding all imaging and laboratory results and appropriate follow up for these results. All education provided in verbal form with additional information in written form. Time was allowed for answering of patient questions. Patient discharged.    Emergency Department Medication Summary:   Medications  acetaminophen (TYLENOL) tablet 1,000 mg (1,000 mg Oral Given 09/28/22 1706)  ipratropium-albuterol (DUONEB) 0.5-2.5 (3) MG/3ML nebulizer solution 3 mL (3 mLs Nebulization Given 09/28/22 1706)  oxyCODONE (Oxy IR/ROXICODONE) immediate release tablet 5 mg (5 mg Oral Given 09/28/22 1943)         Clinical Impression:  1. Chest pain, unspecified type      Data Unavailable   Final Clinical Impression(s) / ED Diagnoses Final diagnoses:  Chest pain, unspecified type    Rx / DC Orders ED Discharge Orders     None         Glyn Ade, MD 09/28/22 2028

## 2022-12-14 ENCOUNTER — Other Ambulatory Visit: Payer: Self-pay | Admitting: Interventional Cardiology

## 2023-01-08 ENCOUNTER — Other Ambulatory Visit: Payer: Self-pay | Admitting: Interventional Cardiology

## 2023-01-20 ENCOUNTER — Ambulatory Visit (HOSPITAL_COMMUNITY)
Admission: EM | Admit: 2023-01-20 | Discharge: 2023-01-20 | Disposition: A | Discharge status: Home / Self Care | Payer: Medicare Other | Attending: Emergency Medicine | Admitting: Emergency Medicine

## 2023-01-20 ENCOUNTER — Telehealth (HOSPITAL_COMMUNITY): Payer: Self-pay | Admitting: Emergency Medicine

## 2023-01-20 ENCOUNTER — Encounter (HOSPITAL_COMMUNITY): Payer: Self-pay

## 2023-01-20 ENCOUNTER — Ambulatory Visit (INDEPENDENT_AMBULATORY_CARE_PROVIDER_SITE_OTHER): Payer: Medicare Other

## 2023-01-20 DIAGNOSIS — M79641 Pain in right hand: Secondary | ICD-10-CM

## 2023-01-20 LAB — URIC ACID: Uric Acid, Serum: 11.9 mg/dL — ABNORMAL HIGH (ref 2.5–7.1)

## 2023-01-20 MED ORDER — ACETAMINOPHEN 325 MG PO TABS
ORAL_TABLET | ORAL | Status: AC
  Filled 2023-01-20: qty 2

## 2023-01-20 MED ORDER — ACETAMINOPHEN 325 MG PO TABS
650.0000 mg | ORAL_TABLET | Freq: Once | ORAL | Status: AC
  Administered 2023-01-20: 650 mg via ORAL

## 2023-01-20 MED ORDER — DEXAMETHASONE SODIUM PHOSPHATE 10 MG/ML IJ SOLN
10.0000 mg | Freq: Once | INTRAMUSCULAR | Status: AC
  Administered 2023-01-20: 10 mg via INTRAMUSCULAR

## 2023-01-20 MED ORDER — DEXAMETHASONE SODIUM PHOSPHATE 10 MG/ML IJ SOLN
INTRAMUSCULAR | Status: AC
  Filled 2023-01-20: qty 1

## 2023-01-20 MED ORDER — ACETAMINOPHEN 500 MG PO TABS: 500.0000 mg | ORAL_TABLET | Freq: Four times a day (QID) | ORAL | 0 refills | Status: AC | PRN

## 2023-01-20 NOTE — ED Provider Notes (Signed)
[facility]    CSN: [id number] Arrival date & time: 01/20/23  0801      History   Chief Complaint Chief Complaint  Patient presents with   Arm Pain    HPI Diane Steele is a 62 y.o. female.   Patient presents to clinic for severe right hand pain that has been worsening since Sunday.  She denies any trauma, falls or injuries to the hand.  She did have a previous break in this wrist in childhood.  Denies any breaks in the skin, bug bites, or recent skin injuries. Hand is swollen along second and third metacarpal.   Patient in tears over the severe pain, reports she has been unable to sleep.  Denies any history of gout, does have multiple comorbidities and chronic diseases.  Has been taken Tylenol Motrin without much relief to her pain.  Has not taken anything today.    The history is provided by the patient and medical records.  Arm Pain    Past Medical History:  Diagnosis Date   A-fib (HCC)    Anginal pain (HCC)    Anxiety    Aortic atherosclerosis (HCC)    Arthritis    Back pain    Bradycardia    CAD (coronary artery disease)    Caregiver stress    CHF (congestive heart failure) (HCC)    CKD (chronic kidney disease)    Coagulation defect (HCC)    Coronary artery disease    Depression    Diabetes mellitus without complication (HCC)    GERD (gastroesophageal reflux disease)    Hair loss    High cholesterol    History of DVT (deep vein thrombosis)    History of tobacco use    Hypertension    Insomnia    Long term current use of oral hypoglycemic drug    Lower leg edema    Memory loss    Morbid obesity (HCC)    Myocardial infarction (HCC)    " MILD "  IN 1997   Nausea and vomiting    Neuromuscular disorder (HCC)    DIABETIC NEUROPATHY   OSA (obstructive sleep apnea)    Periumbilical abdominal pain    Shortness of breath    Type 2 diabetes mellitus with both eyes affected by mild nonproliferative retinopathy without macular edema, without  long-term current use of insulin (HCC)    Type 2 diabetes mellitus with microalbuminuria (HCC)    Type 2 diabetes mellitus with stage 4 chronic kidney disease (HCC)    Vitamin D deficiency     Patient Active Problem List   Diagnosis Date Noted   Acute bronchitis 04/05/2020   Acute pansinusitis 04/05/2020   Atrial fibrillation (HCC) 04/05/2020   Heart disease 04/05/2020   Hypertensive disorder 04/05/2020   Diabetes (HCC) 04/05/2020   Hypothyroidism 09/03/2016    Past Surgical History:  Procedure Laterality Date   CHOLECYSTECTOMY     CORONARY ARTERY BYPASS GRAFT     CORONARY STENT PLACEMENT     CYST EXCISION     CHEST & WRIST    OB History   No obstetric history on file.      Home Medications    Prior to Admission medications   Medication Sig Start Date End Date Taking? Authorizing Provider  acetaminophen (TYLENOL) 500 MG tablet Take 1 tablet (500 mg total) by mouth every 6 (six) hours as needed. 01/20/23  Yes Rinaldo Ratel, [place], FNP  amLODipine (NORVASC) 10 MG tablet Take 10 mg  by mouth daily. 06/17/21  Yes [provider]  apixaban (ELIQUIS) 5 MG TABS tablet Eliquis 5 mg tablet   Yes [provider]  Blood Glucose Monitoring Suppl (ONETOUCH VERIO) w/Device KIT Use 1-4 times daily as needed/directed DX E11.9 04/11/20  Yes Romero Belling, MD  Cholecalciferol (VITAMIN D3) 1.25 MG (50000 UT) CAPS Take 1 capsule by mouth once a week. 04/29/21  Yes [provider]  furosemide (LASIX) 20 MG tablet Take 40 mg by mouth 2 (two) times daily.   Yes Rinaldo Cloud, MD  glucose blood (ONETOUCH VERIO) test strip 1 each by Other route 2 (two) times daily. And lancets 2/day 04/08/21  Yes Romero Belling, MD  isosorbide mononitrate (IMDUR) 60 MG 24 hr tablet Take 60 mg by mouth daily.   Yes [provider]  Lancets [facility] ULTRASOFT) lancets Use 1-4 times daily as needed/directed  DX E11.9 04/11/20  Yes Romero Belling, MD  levothyroxine (SYNTHROID) 75 MCG  tablet Take 75 mcg by mouth daily. 05/05/21  Yes [provider]  metoprolol tartrate (LOPRESSOR) 25 MG tablet Take 1 tablet (25 mg total) by mouth 2 (two) times daily. Patient taking differently: Take 12.5 mg by mouth 2 (two) times daily. 01/16/13  Yes Rinaldo Cloud, MD  Multiple Vitamin (MULTI-VITAMIN DAILY PO) Take 1 tablet by mouth daily.   Yes [provider]  pantoprazole (PROTONIX) 40 MG tablet Take 40 mg by mouth daily. 06/17/21  Yes [provider]  potassium chloride (K-DUR) 10 MEQ tablet Take 10 mEq by mouth daily.   Yes [provider]  repaglinide (PRANDIN) 1 MG tablet Take 1 tablet (1 mg total) by mouth 2 (two) times daily before a meal. 10/13/21  Yes Romero Belling, MD  rosuvastatin (CRESTOR) 40 MG tablet Take 1 tablet (40 mg total) by mouth daily. 01/08/23  Yes Corky Crafts, MD  Semaglutide (RYBELSUS) 14 MG TABS Take 14 mg by mouth daily. 04/08/21  Yes Romero Belling, MD  ULTRAM 50 MG tablet Take 50 mg by mouth every 6 (six) hours. 10/26/19  Yes [provider]  nitroGLYCERIN (NITROSTAT) 0.4 MG SL tablet Place 0.4 mg under the tongue every 5 (five) minutes as needed for chest pain.    [provider]    Family History Family History  Problem Relation Age of Onset   Diabetes Mother    Diabetes Father    Breast cancer Sister 10   Diabetes Sister    Breast cancer Sister 4   Diabetes Sister    Diabetes Sister    Diabetes Sister    High blood pressure Daughter    Breast cancer Maternal Aunt 24   Heart attack Brother     Social History Social History   Tobacco Use   Smoking status: Former    Types: Cigarettes    Start date: 01/25/2002   Smokeless tobacco: Never  Vaping Use   Vaping status: Never Used  Substance Use Topics   Alcohol use: Yes    Comment: SOCIAL   Drug use: No     Allergies   Patient has no known allergies.   Review of Systems Review of Systems  Constitutional:  Negative for fever.   Musculoskeletal:  Positive for joint swelling.     Physical Exam Triage Vital Signs ED Triage Vitals [01/20/23 0820]  Encounter Vitals Group     BP 100/60     Systolic BP Percentile      Diastolic BP Percentile      Pulse Rate  72     Resp 16     Temp 98.1 F (36.7 C)     Temp Source Oral     SpO2 98 %     Weight      Height      Head Circumference      Peak Flow      Pain Score      Pain Loc      Pain Education      Exclude from Growth Chart    No data found.  Updated Vital Signs BP 100/60 (BP Location: Left Arm)   Pulse 72   Temp 98.1 F (36.7 C) (Oral)   Resp 16   SpO2 98%   Visual Acuity Right Eye Distance:   Left Eye Distance:   Bilateral Distance:    Right Eye Near:   Left Eye Near:    Bilateral Near:     Physical Exam Vitals and nursing note reviewed.  Constitutional:      Appearance: Normal appearance.  HENT:     Head: Normocephalic and atraumatic.     Right Ear: External ear normal.     Left Ear: External ear normal.     Nose: Nose normal.     Mouth/Throat:     Mouth: Mucous membranes are moist.  Eyes:     Conjunctiva/sclera: Conjunctivae normal.  Cardiovascular:     Rate and Rhythm: Normal rate.     Pulses: Normal pulses.  Pulmonary:     Effort: Pulmonary effort is normal.  Musculoskeletal:        General: Swelling and tenderness present. No deformity or signs of injury. Normal range of motion.     Right hand: Swelling, tenderness and bony tenderness present. No deformity. There is no disruption of two-point discrimination. Normal capillary refill. Normal pulse.     Comments: Swelling over second and third distal metacarpals of right hand.  Neurovascularly intact.  Skin:    General: Skin is warm and dry.  Neurological:     General: No focal deficit present.     Mental Status: She is alert and oriented to person, place, and time.  Psychiatric:        Mood and Affect: Mood normal.        Behavior: Behavior normal. Behavior is  cooperative.      UC Treatments / Results  Labs (all labs ordered are listed, but only abnormal results are displayed) Labs Reviewed  URIC ACID    EKG   Radiology No results found.  Procedures Procedures (including critical care time)  Medications Ordered in UC Medications  dexamethasone (DECADRON) injection 10 mg (has no administration in time range)  acetaminophen (TYLENOL) tablet 650 mg (has no administration in time range)    Initial Impression / Assessment and Plan / UC Course  I have reviewed the triage vital signs and the nursing notes.  Pertinent labs & imaging results that were available during my care of the patient were reviewed by me and considered in my medical decision making (see chart for details).  Vitals and triage reviewed, patient is hemodynamically stable.  Has swelling and tenderness over the second and third distal metacarpals of the right hand.  Neurovascularly intact with brisk capillary refill and strong radial pulse.  Atraumatic.  Imaging shows no acute fractures or dislocations, does have some arthritic changes.  Awaiting official radiology overread.  Will draw uric acid levels in clinic, suspicious for gout.  Patient is not a good candidate for oral glucocorticoids  with her history of type 2 diabetes, she is not a good candidate for NSAIDs as she has diminished creatinine and renal function.  Will treat with a one-time steroid injection in clinic and encouraged Tylenol.  Ortho/PCP follow-up if indicated.  Will contact if uric acid levels are elevated.  Low purine diet plan provided.  Plan of care, follow-up care and return precautions given, no questions at this time.    Final Clinical Impressions(s) / UC Diagnoses   Final diagnoses:  Right hand pain     Discharge Instructions      Your imaging did not show any acute breaks or fractures.  We are checking your uric acid today, if elevated it would confirm our suspicion of gout.  We have given  you a one-time steroid injection to help with your pain and inflammation, please take Tylenol extra strength every 6 hours as needed for pain.  If your pain persist, please follow-up with your primary care provider or the hand specialist, [facility].  I have attached some information on a low purine diet, if your pain and swelling are due to gout this diet will help to avoid gout flareups in the future.  Return to clinic for any new or concerning symptoms.      ED Prescriptions     Medication Sig Dispense Auth. Provider   acetaminophen (TYLENOL) 500 MG tablet Take 1 tablet (500 mg total) by mouth every 6 (six) hours as needed. 30 tablet Ryett Hamman, [place], [place]      PDMP not reviewed this encounter.   Rinaldo Ratel [place], [place] 01/20/23 [phone]

## 2023-01-20 NOTE — ED Triage Notes (Signed)
Here for right arm pain and swelling. Pt denies any recent injuries or trauma. Pt has been taking Motrin and Tylenol with no relief.

## 2023-01-20 NOTE — Discharge Instructions (Addendum)
Your imaging did not show any acute breaks or fractures.  We are checking your uric acid today, if elevated it would confirm our suspicion of gout.  We have given you a one-time steroid injection to help with your pain and inflammation, please take Tylenol extra strength every 6 hours as needed for pain.  If your pain persist, please follow-up with your primary care provider or the hand specialist, [facility].  I have attached some information on a low purine diet, if your pain and swelling are due to gout this diet will help to avoid gout flareups in the future.  Return to clinic for any new or concerning symptoms.

## 2023-01-20 NOTE — Telephone Encounter (Signed)
Left VM that uric acid levels were elevated and to follow low purine eating plan that was provided w/ DC papers.

## 2023-01-21 ENCOUNTER — Other Ambulatory Visit: Payer: Self-pay | Admitting: Interventional Cardiology

## 2023-01-25 ENCOUNTER — Other Ambulatory Visit: Payer: Self-pay | Admitting: Family

## 2023-01-25 DIAGNOSIS — M7989 Other specified soft tissue disorders: Secondary | ICD-10-CM

## 2023-02-05 ENCOUNTER — Other Ambulatory Visit: Payer: Self-pay | Admitting: Interventional Cardiology

## 2023-02-15 ENCOUNTER — Other Ambulatory Visit: Payer: Self-pay | Admitting: Interventional Cardiology

## 2023-02-28 ENCOUNTER — Ambulatory Visit
Admission: RE | Admit: 2023-02-28 | Discharge: 2023-02-28 | Disposition: A | Discharge status: Home / Self Care | Payer: Medicare Other | Source: Ambulatory Visit | Attending: Family | Admitting: Family

## 2023-02-28 DIAGNOSIS — M7989 Other specified soft tissue disorders: Secondary | ICD-10-CM

## 2023-02-28 MED ORDER — GADOPICLENOL 0.5 MMOL/ML IV SOLN
10.0000 mL | Freq: Once | INTRAVENOUS | Status: AC | PRN
  Administered 2023-02-28: 10 mL via INTRAVENOUS

## 2023-04-09 ENCOUNTER — Other Ambulatory Visit: Payer: Self-pay | Admitting: Interventional Cardiology

## 2023-04-09 NOTE — Telephone Encounter (Signed)
Pt is overdue for an yearly appt with Cardiogy and pt has had 3 attempts. Would provider like to refill? Please address

## 2023-04-12 ENCOUNTER — Other Ambulatory Visit: Payer: Self-pay | Admitting: Interventional Cardiology

## 2023-09-02 ENCOUNTER — Encounter: Payer: Self-pay | Admitting: Cardiology

## 2023-09-02 ENCOUNTER — Ambulatory Visit: Payer: Medicare Other | Attending: Cardiology | Admitting: Cardiology

## 2023-09-02 VITALS — BP 116/80 | HR 85 | Wt 213.0 lb

## 2023-09-02 DIAGNOSIS — N184 Chronic kidney disease, stage 4 (severe): Secondary | ICD-10-CM

## 2023-09-02 DIAGNOSIS — Z951 Presence of aortocoronary bypass graft: Secondary | ICD-10-CM | POA: Diagnosis not present

## 2023-09-02 DIAGNOSIS — I25118 Atherosclerotic heart disease of native coronary artery with other forms of angina pectoris: Secondary | ICD-10-CM

## 2023-09-02 DIAGNOSIS — Z955 Presence of coronary angioplasty implant and graft: Secondary | ICD-10-CM

## 2023-09-02 DIAGNOSIS — E1159 Type 2 diabetes mellitus with other circulatory complications: Secondary | ICD-10-CM

## 2023-09-02 DIAGNOSIS — I10 Essential (primary) hypertension: Secondary | ICD-10-CM

## 2023-09-02 DIAGNOSIS — I4811 Longstanding persistent atrial fibrillation: Secondary | ICD-10-CM

## 2023-09-02 DIAGNOSIS — E782 Mixed hyperlipidemia: Secondary | ICD-10-CM

## 2023-09-02 MED ORDER — RANOLAZINE ER 500 MG PO TB12: 500.0000 mg | ORAL_TABLET | Freq: Every day | ORAL | 3 refills | Status: DC

## 2023-09-02 MED ORDER — METOPROLOL TARTRATE 50 MG PO TABS: 50.0000 mg | ORAL_TABLET | Freq: Two times a day (BID) | ORAL | 3 refills | Status: DC

## 2023-09-02 NOTE — Patient Instructions (Signed)
 Medication Instructions:  Your physician has recommended you make the following change in your medication:  INCREASE: metoprolol tartrate (Lopressor) to 50 mg by mouth twice daily  START: ranolazine (Ranexa) 500 mg by mouth once daily  *If you need a refill on your cardiac medications before your next appointment, please call your pharmacy*   Lab Work: NONE  If you have labs (blood work) drawn today and your tests are completely normal, you will receive your results only by: MyChart Message (if you have MyChart) OR A paper copy in the mail If you have any lab test that is abnormal or we need to change your treatment, we will call you to review the results.   Testing/Procedures: Your physician has referred you to the Pharmacy Clinic for cholesterol management.  Your physician has requested that you have a Cardiac PET Scan.   Your physician has requested that you have an echocardiogram. Echocardiography is a painless test that uses sound waves to create images of your heart. It provides your doctor with information about the size and shape of your heart and how well your heart's chambers and valves are working. This procedure takes approximately one hour. There are no restrictions for this procedure. Please do NOT wear cologne, perfume, aftershave, or lotions (deodorant is allowed). Please arrive 15 minutes prior to your appointment time.  Please note: We ask at that you not bring children with you during ultrasound (echo/ vascular) testing. Due to room size and safety concerns, children are not allowed in the ultrasound rooms during exams. Our front office staff cannot provide observation of children in our lobby area while testing is being conducted. An adult accompanying a patient to their appointment will only be allowed in the ultrasound room at the discretion of the ultrasound technician under special circumstances. We apologize for any inconvenience.    Follow-Up: At [facility], you and your health needs are our priority.  As part of our continuing mission to provide you with exceptional heart care, we have created designated Provider Care Teams.  These Care Teams include your primary Cardiologist (physician) and Advanced Practice Providers (APPs -  Physician Assistants and Nurse Practitioners) who all work together to provide you with the care you need, when you need it.  We recommend signing up for the patient portal called "MyChart".  Sign up information is provided on this After Visit Summary.  MyChart is used to connect with patients for Virtual Visits (Telemedicine).  Patients are able to view lab/test results, encounter notes, upcoming appointments, etc.  Non-urgent messages can be sent to your provider as well.   To learn more about what you can do with MyChart, go to [email or web address].    Your next appointment:   8 week(s)  Provider:   Tessa Lerner, DO     Other Instructions    Please report to Radiology at the [facility] Main Entrance 30 minutes early for your test.  [address] [place], [place] [address]                         OR   Please report to Radiology at [facility] Main Entrance, medical mall, 30 mins prior to your test.  [address]  [place], [place]  How to Prepare for Your Cardiac PET/CT Stress Test:  Nothing to eat or drink, except water, 3 hours prior to arrival time.  NO caffeine/decaffeinated products, or chocolate 12 hours  prior to arrival. (Please note decaffeinated beverages (teas/coffees) still contain caffeine).  If you have caffeine within 12 hours prior, the test will need to be rescheduled.  Medication instructions: Do not take erectile dysfunction medications for 72 hours prior to test (sildenafil, tadalafil) Do not take nitrates (isosorbide mononitrate, Ranexa) the day before or day of test Do not take tamsulosin the day before or morning of test Hold  theophylline containing medications for 12 hours. Hold Dipyridamole 48 hours prior to the test.  Diabetic Preparation: If able to eat breakfast prior to 3 hour fasting, you may take all medications, including your insulin. Do not worry if you miss your breakfast dose of insulin - start at your next meal. If you do not eat prior to 3 hour fast-Hold all diabetes (oral and insulin) medications. Patients who wear a continuous glucose monitor MUST remove the device prior to scanning.  You may take your remaining medications with water.  NO perfume, cologne or lotion on chest or abdomen area. FEMALES - Please avoid wearing dresses to this appointment.  Total time is 1 to 2 hours; you may want to bring reading material for the waiting time.  IF YOU THINK YOU MAY BE PREGNANT, OR ARE NURSING PLEASE INFORM THE TECHNOLOGIST.  In preparation for your appointment, medication and supplies will be purchased.  Appointment availability is limited, so if you need to cancel or reschedule, please call the Radiology Department Scheduler at [phone] 24 hours in advance to avoid a cancellation fee of $100.00  What to Expect When you Arrive:  Once you arrive and check in for your appointment, you will be taken to a preparation room within the Radiology Department.  A technologist or Nurse will obtain your medical history, verify that you are correctly prepped for the exam, and explain the procedure.  Afterwards, an IV will be started in your arm and electrodes will be placed on your skin for EKG monitoring during the stress portion of the exam. Then you will be escorted to the PET/CT scanner.  There, staff will get you positioned on the scanner and obtain a blood pressure and EKG.  During the exam, you will continue to be connected to the EKG and blood pressure machines.  A small, safe amount of a radioactive tracer will be injected in your IV to obtain a series of pictures of your heart along with an injection of  a stress agent.    After your Exam:  It is recommended that you eat a meal and drink a caffeinated beverage to counter act any effects of the stress agent.  Drink plenty of fluids for the remainder of the day and urinate frequently for the first couple of hours after the exam.  Your doctor will inform you of your test results within 7-10 business days.  For more information and frequently asked questions, please visit our website: [email or web address]  For questions about your test or how to prepare for your test, please call: Cardiac Imaging Nurse Navigators Office: [phone]

## 2023-09-02 NOTE — Progress Notes (Signed)
 Cardiology Office Note:  .   Date:  09/02/2023  ID:  Diane Steele, DOB 1961/05/20, MRN [id number] PCP:  Raymon Mutton., FNP  Former Cardiology Providers: Dr. Sharyn Lull, Dr. Lance Muss  HeartCare Providers Cardiologist:  Tessa Lerner, DO , [facility] (established care 09/02/23) Electrophysiologist:  None  Click to update primary MD,subspecialty MD or APP then REFRESH:1}    Chief Complaint  Patient presents with   Follow-up    Reestablish care, chest pain    History of Present Illness: .   Diane Steele is a 63 y.o. African-American female whose past medical history and cardiovascular risk factors includes: Coronary artery disease status post surgical revascularization in 1997 at the age of 28 (LIMA to the LAD, SVG to RCA, SVG to the first diagonal, SVG to OM 2), multiple PCI's, family history of premature CAD, hypertension, heart failure, atrial fibrillation (diagnosed in 2014), Aortic atherosclerosis.  Formally under the care of Dr. Lance Muss who last saw Diane Steele back in May 2023. I am seeing her for the first time to re-establishing care.   As per Dr. Lance Muss last office note:   In 1997 she had PTCA of the saphenous vein graft to the RCA.  In 2004, she underwent catheterization.  She had 3.5 x 24 Taxus stent placement to the SVG to the first diagonal.  Later in 2004, she had stent placement from the proximal to distal circumflex.  There were 2 long overlapping stents, 3.75 x 33 Cypher and 3.0 x 38 stent.   Last In 2005 showed severe three-vessel disease.  Patent stents in the circumflex.  Patent SVG to OM 2.  Patent SVG to diagonal.  Patent SVG to distal RCA but there was concern that the distal graft was degenerated and there was only TIMI II flow.  This was not suitable for any intervention per the report.  In addition, patient also has atrial fibrillation diagnosed in 2014.  Was started on amiodarone and Eliquis for thromboembolic  prophylaxis.  Patient is accompanied with her husband at today's office visit.  She not only here to reestablish care but she also has been having chest pain.  Chest pain: Describes heaviness like sensation. Located substernally. Ongoing for the last 1 month. Intensity 7 out of 10. Last for 5 minutes or less. At times radiates to the left arm. Noticeable with overexertion, like going up a flight of stairs. Better with resting Symptoms are similar to her prior events. Unable to provide an explanation of why she has not gone to the ER for more expedited evaluation. No active chest pain  With regards to her atrial fibrillation patient states that the providers that she is seen in the past have recommended rate control strategy.  No prior history of direct-current cardioversion or consideration of atrial fibrillation ablation.  Review of her most recent echocardiogram notes biatrial dilatation.  Review of Systems: .   Review of Systems  Cardiovascular:  Positive for chest pain and dyspnea on exertion. Negative for claudication, irregular heartbeat, leg swelling, near-syncope, orthopnea, palpitations, paroxysmal nocturnal dyspnea and syncope.  Respiratory:  Positive for shortness of breath.   Hematologic/Lymphatic: Negative for bleeding problem.    Studies Reviewed:   EKG: EKG Interpretation Date/Time:  Thursday September 02 2023 08:54:59 EDT Ventricular Rate:  95 PR Interval:    QRS Duration:  70 QT Interval:  354 QTC Calculation: 444 R Axis:   81  Text Interpretation: Atrial fibrillation with premature ventricular or aberrantly  conducted complexes Low voltage QRS Nonspecific T wave abnormality When compared with ECG of 28-Sep-2022 15:26, No significant change since last tracing Confirmed by Tessa Lerner [phone]) on 09/02/2023 8:58:05 AM  Echocardiogram: 07/2021: LVEF 55 to 60%, no regional wall motion abnormalities, RV function stable, RV size mildly dilated, severely biatrial  dilatation, mild to moderate mitral regurgitation, no evidence of mitral stenosis estimated RAP 3 mmHg.  Stress Testing: January 2023 Intermediate risk study.  Findings are equivocal, per report. LVEF moderately reduced-per gated SPECT 30-44%, end-diastolic cavity size is normal, no significant evidence of inducible ischemia, inferoseptal wall appears hypokinetic but no obvious scar on rest or stress images.  RADIOLOGY: NA  Risk Assessment/Calculations:   Click Here to Calculate/Change CHADS2VASc Score The patient's CHADS2-VASc score is 5, indicating a 7.2% annual risk of stroke.  CHF History: Yes HTN History: Yes Diabetes History: Yes Stroke History: No Vascular Disease History: Yes  Labs:       Latest Ref Rng & Units 09/28/2022    3:45 PM 10/27/2021    8:30 AM 07/19/2016   11:20 AM  CBC  WBC 4.0 - 10.5 K/uL 6.5  6.3  5.2   Hemoglobin 12.0 - 15.0 g/dL 60.4  54.0  9.5   Hematocrit 36.0 - 46.0 % 32.9  34.2  30.6   Platelets 150 - 400 K/uL 220  224  202        Latest Ref Rng & Units 09/28/2022    3:45 PM 10/27/2021    8:30 AM 01/22/2020   12:00 AM  BMP  Glucose 70 - 99 mg/dL 981  191    BUN 8 - 23 mg/dL 43  65  48      Creatinine 0.44 - 1.00 mg/dL 4.78  2.95  2.2      BUN/Creat Ratio 12 - 28  28    Sodium 135 - 145 mmol/L 139  139  135      Potassium 3.5 - 5.1 mmol/L 3.4  4.6  5.3      Chloride 98 - 111 mmol/L 106  106  108      CO2 22 - 32 mmol/L 21  16  22       Calcium 8.9 - 10.3 mg/dL 9.5  9.8  9.6         This result is from an external source.      Latest Ref Rng & Units 09/28/2022    3:45 PM 10/27/2021    8:30 AM 01/22/2020   12:00 AM  CMP  Glucose 70 - 99 mg/dL 621  308    BUN 8 - 23 mg/dL 43  65  48      Creatinine 0.44 - 1.00 mg/dL 6.57  8.46  2.2      Sodium 135 - 145 mmol/L 139  139  135      Potassium 3.5 - 5.1 mmol/L 3.4  4.6  5.3      Chloride 98 - 111 mmol/L 106  106  108      CO2 22 - 32 mmol/L 21  16  22       Calcium 8.9 - 10.3 mg/dL 9.5  9.8  9.6       Total Protein 6.0 - 8.5 g/dL  7.2    Total Bilirubin 0.0 - 1.2 mg/dL  0.5    Alkaline Phos 44 - 121 IU/L  81  111      AST 0 - 40 IU/L  15  16  ALT 0 - 32 IU/L  17  32         This result is from an external source.    Lab Results  Component Value Date   CHOL 152 10/27/2021   HDL 60 10/27/2021   LDLCALC 79 10/27/2021   TRIG 64 10/27/2021   CHOLHDL 2.5 10/27/2021   No results for input(s): "LIPOA" in the last 8760 hours. No components found for: "NTPROBNP" No results for input(s): "PROBNP" in the last 8760 hours. No results for input(s): "TSH" in the last 8760 hours.  External Labs: Collected: 08/05/2023 [facility] database. Total cholesterol 167, triglycerides 62, HDL 65, LDL 90, A1c 6.1.  TSH 8.3  Physical Exam:    Today's Vitals   09/02/23 0850  BP: 116/80  Pulse: 85  SpO2: 98%  Weight: 213 lb (96.6 kg)   Body mass index is 35.45 kg/m. Wt Readings from Last 3 Encounters:  09/02/23 213 lb (96.6 kg)  10/21/21 253 lb (114.8 kg)  10/13/21 246 lb 6.4 oz (111.8 kg)    Physical Exam  Constitutional: No distress.  hemodynamically stable  Neck: No JVD present.  Cardiovascular: Normal rate, regular rhythm, S1 normal and S2 normal. Exam reveals no gallop, no S3 and no S4.  No murmur heard. Pulmonary/Chest: Effort normal and breath sounds normal. No stridor. She has no wheezes. She has no rales.  Musculoskeletal:        General: No edema.     Cervical back: Neck supple.  Skin: Skin is warm.    Impression & Recommendation(s):  Impression:   ICD-10-CM   1. Coronary artery disease involving native coronary artery of native heart with other form of angina pectoris (HCC)  I25.118 EKG 12-Lead    AMB Referral to [facility] Pharm-D    NM PET CT CARDIAC PERFUSION MULTI W/ABSOLUTE BLOODFLOW    ECHOCARDIOGRAM COMPLETE    2. Hx of CABG  Z95.1 AMB Referral to [facility] Pharm-D    NM PET CT CARDIAC PERFUSION MULTI W/ABSOLUTE BLOODFLOW    ECHOCARDIOGRAM COMPLETE    3. History  of coronary artery stent placement  Z95.5 AMB Referral to [facility] Pharm-D    NM PET CT CARDIAC PERFUSION MULTI W/ABSOLUTE BLOODFLOW    ECHOCARDIOGRAM COMPLETE    4. Type 2 diabetes mellitus with other circulatory complication, without long-term current use of insulin (HCC)  E11.59 AMB Referral to [facility] Pharm-D    NM PET CT CARDIAC PERFUSION MULTI W/ABSOLUTE BLOODFLOW    ECHOCARDIOGRAM COMPLETE    5. Mixed hyperlipidemia  E78.2 AMB Referral to [facility] Pharm-D    NM PET CT CARDIAC PERFUSION MULTI W/ABSOLUTE BLOODFLOW    ECHOCARDIOGRAM COMPLETE    6. Essential hypertension  I10 AMB Referral to [facility] Pharm-D    NM PET CT CARDIAC PERFUSION MULTI W/ABSOLUTE BLOODFLOW    ECHOCARDIOGRAM COMPLETE    7. CKD (chronic kidney disease) stage 4, GFR 15-29 ml/min (HCC)  N18.4 AMB Referral to [facility] Pharm-D    NM PET CT CARDIAC PERFUSION MULTI W/ABSOLUTE BLOODFLOW    ECHOCARDIOGRAM COMPLETE    8. Longstanding persistent atrial fibrillation (HCC)  I48.11 AMB Referral to [facility] Pharm-D    NM PET CT CARDIAC PERFUSION MULTI W/ABSOLUTE BLOODFLOW    ECHOCARDIOGRAM COMPLETE       Recommendation(s):  Coronary artery disease involving native coronary artery of native heart with other form of angina pectoris (HCC) Hx of CABG History of coronary artery stent placement Patient has had a history of premature coronary artery disease with surgical revascularization at  the age of 35 with Dr. Tyrone Sage. She presents today not only to reestablish care but has been experiencing anginal chest pain. No active chest pain at the time of the evaluation. EKG illustrates sinus rhythm without underlying ischemia or injury pattern. Symptoms ongoing for the last 1 month at least. Antianginal therapies include: Norvasc 10 mg p.o. daily, Imdur 60 mg p.o. daily, Lopressor 25 mg p.o. twice daily, and as needed nitroglycerin Echo will be ordered to evaluate for structural heart disease and left ventricular  systolic function. Given her symptoms she definitely needs to be evaluated for progression of coronary artery disease.  Ideal test of choice would be a left heart catheterization with possible intervention.  However given her chronic kidney disease and her increased risk for contrast-induced nephropathy this shared decision was to proceed forward with a noninvasive strategy and up titration of antianginal therapy.  However, if the symptoms increase in intensity frequency or duration patient understands that she needs to go to the closest ER via EMS for further evaluation and management. Cardiac PET/CT to evaluate for reversible ischemia. Will increase Lopressor from 25 mg p.o. twice daily to 50 mg p.o. twice daily. Start Ranexa 500 mg p.o. daily Currently on high intensity statin therapy tolerated Crestor 40 mg p.o. daily.  LDL is not at goal. Given her CABG at a relatively young age followed by multiple coronary interventions recommend a goal LDL <55 mg/dL.  Will refer her to Pharm.D. for initiation of PCSK9 inhibitor versus Leqvio based on coverage  Type 2 diabetes mellitus with other circulatory complication, without long-term current use of insulin (HCC) Reemphasized importance of glycemic control. Most recent hemoglobin A1c is a February 2025 is well-controlled at 6.1 She follows up with Dr. Cleon Gustin  Mixed hyperlipidemia Continue rosuvastatin 40 mg p.o. daily. Does not endorse myalgias. Lipid profile from February 2025 illustrates an LDL level of 90 mg/dL. As mentioned above recommending a goal LDL <55 mg/dL given her CAD, CABG, and multiple PCI's. Refer to Pharm.D. for evaluation for PCSK9 inhibitors versus in Leqvio  Essential hypertension Office blood pressures are very well-controlled. As mentioned above will increase the dose of Lopressor for now, will titrate to Toprol-XL at the next visit Addition of Ranexa as discussed above. Continue amlodipine 10 mg p.o. daily. Continue Lasix  20 mg 2 tabs twice daily, will defer diuretic management to nephrology. Continue olmesartan 40 mg p.o. daily  CKD (chronic kidney disease) stage 4, GFR 15-29 ml/min (HCC) Medications as discussed above. Does follow-up with nephrology.  Longstanding persistent atrial fibrillation (HCC) Appears to have long persistent atrial fibrillation. Currently managed with rate control strategy. Last echocardiogram notes biatrial enlargement. Remote history of being on amiodarone. Will focus on completing the ischemic evaluation for now and will discuss A-fib management at the follow-up visits.  Overall I suspect she will likely need medical therapy given the size of her biatrial enlargement. Patient does not endorse evidence of bleeding. Risks, benefits, alternatives to oral anticoagulation discussed.   Orders Placed:  Orders Placed This Encounter  Procedures   NM PET CT CARDIAC PERFUSION MULTI W/ABSOLUTE BLOODFLOW    Standing Status:   Future    Expiration Date:   09/01/2024    If indicated for the ordered procedure, I authorize the administration of a radiopharmaceutical per Radiology protocol:   Yes    Preferred Imaging Location:   [facility]   AMB Referral to [facility] Pharm-D    Referral Priority:   Routine    Referral Type:  Consultation    Referral Reason:   Specialty Services Required    Number of Visits Requested:   1   EKG 12-Lead   ECHOCARDIOGRAM COMPLETE    Standing Status:   Future    Expiration Date:   09/01/2024    Where should this test be performed:   [facility])    Does the patient weigh less than or greater than 250 lbs?:   Patient weighs less than 250 lbs    Perflutren DEFINITY (image enhancing agent) should be administered unless hypersensitivity or allergy exist:   Administer Perflutren    Reason for exam-Echo:   Other-Full Diagnosis List    Full ICD-10/Reason for Exam:   CAD (coronary artery disease) [132440]    Full ICD-10/Reason for  Exam:   HTN (hypertension) [102725]   Final Medication List:    Meds ordered this encounter  Medications   metoprolol tartrate (LOPRESSOR) 50 MG tablet    Sig: Take 1 tablet (50 mg total) by mouth 2 (two) times daily.    Dispense:  180 tablet    Refill:  3   ranolazine (RANEXA) 500 MG 12 hr tablet    Sig: Take 1 tablet (500 mg total) by mouth daily.    Dispense:  90 tablet    Refill:  3    Medications Discontinued During This Encounter  Medication Reason   metoprolol tartrate (LOPRESSOR) 25 MG tablet Dose change     Current Outpatient Medications:    acetaminophen (TYLENOL) 500 MG tablet, Take 1 tablet (500 mg total) by mouth every 6 (six) hours as needed., Disp: 30 tablet, Rfl: 0   amLODipine (NORVASC) 10 MG tablet, Take 10 mg by mouth daily., Disp: , Rfl:    apixaban (ELIQUIS) 5 MG TABS tablet, Eliquis 5 mg tablet, Disp: , Rfl:    Blood Glucose Monitoring Suppl (ONETOUCH VERIO) w/Device KIT, Use 1-4 times daily as needed/directed DX E11.9, Disp: 1 kit, Rfl: 0   Cholecalciferol (VITAMIN D3) 1.25 MG (50000 UT) CAPS, Take 1 capsule by mouth once a week., Disp: , Rfl:    furosemide (LASIX) 20 MG tablet, Take 40 mg by mouth 2 (two) times daily., Disp: , Rfl:    glucose blood (ONETOUCH VERIO) test strip, 1 each by Other route 2 (two) times daily. And lancets 2/day, Disp: 200 strip, Rfl: 3   isosorbide mononitrate (IMDUR) 60 MG 24 hr tablet, Take 60 mg by mouth daily., Disp: , Rfl:    Lancets (ONETOUCH ULTRASOFT) lancets, Use 1-4 times daily as needed/directed  DX E11.9, Disp: 200 each, Rfl: 12   metoprolol tartrate (LOPRESSOR) 50 MG tablet, Take 1 tablet (50 mg total) by mouth 2 (two) times daily., Disp: 180 tablet, Rfl: 3   MOUNJARO 5 MG/0.5ML Pen, Inject 5 mg into the skin once a week., Disp: , Rfl:    Multiple Vitamin (MULTI-VITAMIN DAILY PO), Take 1 tablet by mouth daily., Disp: , Rfl:    nitroGLYCERIN (NITROSTAT) 0.4 MG SL tablet, Place 0.4 mg under the tongue every 5 (five) minutes  as needed for chest pain., Disp: , Rfl:    OLMESARTAN MEDOXOMIL-HCTZ PO, Take 40 mg by mouth daily., Disp: , Rfl:    pantoprazole (PROTONIX) 40 MG tablet, Take 40 mg by mouth daily., Disp: , Rfl:    potassium chloride (K-DUR) 10 MEQ tablet, Take 10 mEq by mouth daily., Disp: , Rfl:    ranolazine (RANEXA) 500 MG 12 hr tablet, Take 1 tablet (500 mg total) by  mouth daily., Disp: 90 tablet, Rfl: 3   rosuvastatin (CRESTOR) 40 MG tablet, TAKE 1 TABLET BY MOUTH EVERY DAY, Disp: 15 tablet, Rfl: 0   Semaglutide (RYBELSUS) 14 MG TABS, Take 14 mg by mouth daily., Disp: 90 tablet, Rfl: 3   ULTRAM 50 MG tablet, Take 50 mg by mouth every 6 (six) hours., Disp: , Rfl:    levothyroxine (SYNTHROID) 75 MCG tablet, Take 75 mcg by mouth daily. (Patient not taking: Reported on 09/02/2023), Disp: , Rfl:    repaglinide (PRANDIN) 1 MG tablet, Take 1 tablet (1 mg total) by mouth 2 (two) times daily before a meal. (Patient not taking: Reported on 09/02/2023), Disp: 180 tablet, Rfl: 1  Consent:   Informed Consent   Shared Decision Making/Informed Consent The risks [chest pain, shortness of breath, cardiac arrhythmias, dizziness, blood pressure fluctuations, myocardial infarction, stroke/transient ischemic attack, nausea, vomiting, allergic reaction, radiation exposure, metallic taste sensation and life-threatening complications (estimated to be 1 in 10,000)], benefits (risk stratification, diagnosing coronary artery disease, treatment guidance) and alternatives of a cardiac PET stress test were discussed in detail with Ms. Crammer and she agrees to proceed.     Disposition:   8 weeks sooner if needed  Her questions and concerns were addressed to her satisfaction. She voices understanding of the recommendations provided during this encounter.    Signed, Tessa Lerner, DO, [facility]  [facility]  [address] [place], [place] [address] 09/02/2023 12:36 PM

## 2023-09-22 ENCOUNTER — Ambulatory Visit (HOSPITAL_COMMUNITY): Attending: Cardiology

## 2023-09-22 DIAGNOSIS — I4811 Longstanding persistent atrial fibrillation: Secondary | ICD-10-CM | POA: Insufficient documentation

## 2023-09-22 DIAGNOSIS — E782 Mixed hyperlipidemia: Secondary | ICD-10-CM | POA: Insufficient documentation

## 2023-09-22 DIAGNOSIS — Z955 Presence of coronary angioplasty implant and graft: Secondary | ICD-10-CM | POA: Diagnosis present

## 2023-09-22 DIAGNOSIS — I10 Essential (primary) hypertension: Secondary | ICD-10-CM | POA: Insufficient documentation

## 2023-09-22 DIAGNOSIS — I25118 Atherosclerotic heart disease of native coronary artery with other forms of angina pectoris: Secondary | ICD-10-CM | POA: Diagnosis present

## 2023-09-22 DIAGNOSIS — N184 Chronic kidney disease, stage 4 (severe): Secondary | ICD-10-CM | POA: Diagnosis present

## 2023-09-22 DIAGNOSIS — Z951 Presence of aortocoronary bypass graft: Secondary | ICD-10-CM | POA: Insufficient documentation

## 2023-09-22 DIAGNOSIS — E1159 Type 2 diabetes mellitus with other circulatory complications: Secondary | ICD-10-CM | POA: Diagnosis present

## 2023-09-22 LAB — ECHOCARDIOGRAM COMPLETE
Est EF: 55
MV M vel: 4 m/s
MV Peak grad: 64.1 mmHg
S' Lateral: 2.6 cm

## 2023-09-25 ENCOUNTER — Encounter: Payer: Self-pay | Admitting: Cardiology

## 2023-10-19 ENCOUNTER — Encounter (HOSPITAL_COMMUNITY): Payer: Self-pay

## 2023-10-19 ENCOUNTER — Telehealth (HOSPITAL_COMMUNITY): Payer: Self-pay | Admitting: *Deleted

## 2023-10-19 NOTE — Telephone Encounter (Signed)

## 2023-10-20 ENCOUNTER — Ambulatory Visit (HOSPITAL_COMMUNITY)
Admission: RE | Admit: 2023-10-20 | Discharge: 2023-10-20 | Disposition: A | Discharge status: Home / Self Care | Source: Ambulatory Visit | Attending: Cardiology | Admitting: Cardiology

## 2023-10-20 DIAGNOSIS — E1159 Type 2 diabetes mellitus with other circulatory complications: Secondary | ICD-10-CM | POA: Insufficient documentation

## 2023-10-20 DIAGNOSIS — N184 Chronic kidney disease, stage 4 (severe): Secondary | ICD-10-CM | POA: Diagnosis present

## 2023-10-20 DIAGNOSIS — I4811 Longstanding persistent atrial fibrillation: Secondary | ICD-10-CM | POA: Diagnosis present

## 2023-10-20 DIAGNOSIS — I25118 Atherosclerotic heart disease of native coronary artery with other forms of angina pectoris: Secondary | ICD-10-CM | POA: Diagnosis present

## 2023-10-20 DIAGNOSIS — Z955 Presence of coronary angioplasty implant and graft: Secondary | ICD-10-CM | POA: Insufficient documentation

## 2023-10-20 DIAGNOSIS — Z951 Presence of aortocoronary bypass graft: Secondary | ICD-10-CM | POA: Insufficient documentation

## 2023-10-20 DIAGNOSIS — I10 Essential (primary) hypertension: Secondary | ICD-10-CM | POA: Insufficient documentation

## 2023-10-20 DIAGNOSIS — E782 Mixed hyperlipidemia: Secondary | ICD-10-CM | POA: Diagnosis present

## 2023-10-20 LAB — NM PET CT CARDIAC PERFUSION MULTI W/ABSOLUTE BLOODFLOW
MBFR: 1.64
Nuc Rest EF: 45 %
Nuc Stress EF: 39 %
Rest MBF: 0.78 ml/g/min
Rest Nuclear Isotope Dose: 25 mCi
ST Depression (mm): 0 mm
Stress MBF: 1.28 ml/g/min
Stress Nuclear Isotope Dose: 25 mCi
TID: 1.18

## 2023-10-20 MED ORDER — RUBIDIUM RB82 GENERATOR (RUBYFILL)
25.0300 | PACK | Freq: Once | INTRAVENOUS | Status: AC
  Administered 2023-10-20: 25.03 via INTRAVENOUS

## 2023-10-20 MED ORDER — REGADENOSON 0.4 MG/5ML IV SOLN
0.4000 mg | Freq: Once | INTRAVENOUS | Status: AC
  Administered 2023-10-20: 0.4 mg via INTRAVENOUS

## 2023-10-20 MED ORDER — REGADENOSON 0.4 MG/5ML IV SOLN
INTRAVENOUS | Status: AC
  Filled 2023-10-20: qty 5

## 2023-10-20 MED ORDER — RUBIDIUM RB82 GENERATOR (RUBYFILL)
25.0200 | PACK | Freq: Once | INTRAVENOUS | Status: AC
  Administered 2023-10-20: 25.02 via INTRAVENOUS

## 2023-10-22 ENCOUNTER — Other Ambulatory Visit (HOSPITAL_BASED_OUTPATIENT_CLINIC_OR_DEPARTMENT_OTHER): Payer: Self-pay

## 2023-10-27 ENCOUNTER — Telehealth: Payer: Self-pay

## 2023-10-27 ENCOUNTER — Ambulatory Visit: Attending: Cardiology | Admitting: Pharmacist

## 2023-10-27 ENCOUNTER — Encounter: Payer: Self-pay | Admitting: Pharmacist

## 2023-10-27 ENCOUNTER — Other Ambulatory Visit (HOSPITAL_COMMUNITY): Payer: Self-pay

## 2023-10-27 DIAGNOSIS — E785 Hyperlipidemia, unspecified: Secondary | ICD-10-CM | POA: Diagnosis not present

## 2023-10-27 DIAGNOSIS — I25118 Atherosclerotic heart disease of native coronary artery with other forms of angina pectoris: Secondary | ICD-10-CM

## 2023-10-27 NOTE — Patient Instructions (Addendum)
 I will submit a prior authorization for Repatha. I will call you once I hear back. Please call me at [phone] with any questions.   Repatha is a cholesterol medication that improved your body's ability to get rid of "bad cholesterol" known as LDL. It can lower your LDL up to 60%! It is an injection that is given under the skin every 2 weeks. The medication often requires a prior authorization from your insurance company. We will take care of submitting all the necessary information to your insurance company to get it approved. The most common side effects of Repatha include runny nose, symptoms of the common cold, rarely flu or flu-like symptoms, back/muscle pain in about 3-4% of the patients, and redness, pain, or bruising at the injection site. Tell your healthcare provider if you have any side effect that bothers you or that does not go away.

## 2023-10-27 NOTE — Assessment & Plan Note (Signed)
 Assessment: LDL-C is above goal of less than 55 due to premature and progressive ASCVD Patient has strong family history.  Confirmed her children have had their risk factors monitored Reviewed PCSK9 Injection technique reviewed.  Patient takes Mounjaro and is comfortable with injections She does have SNP and her cost of medications are reduced.  She states that this time all her medications are free  Plan: Will submit prior authorization for Repatha Patient would like filled at [facility] pharmacy Continue rosuvastatin  40 mg daily Repeat labs in 3 months Handouts on proper nutrition provided to patient

## 2023-10-27 NOTE — Telephone Encounter (Signed)
-----   Message from Benancio Bracket sent at 10/27/2023 12:36 PM EDT ----- Please do pa for reapatha- ASCVD on rosuvastatin  40mg  LDL-C 90

## 2023-10-27 NOTE — Progress Notes (Signed)
 Patient ID: Diane Steele                 DOB: 13-May-1961                    MRN: [id number]      HPI: Diane Steele is a 63 y.o. female patient referred to lipid clinic by Dr. Albert Huff. 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  Patient presents today accompanied by her husband.  She is motivated to improve her health.  Very concerned about her kidneys as her EGFR has declined..  Reviewed PCSK-9 inhibitors. Discussed mechanisms of action, dosing, side effects and potential decreases in LDL cholesterol.  Also reviewed cost information and potential options for patient assistance.   Current Medications: rosuvastatin  40mg  daily Intolerances: None Risk Factors: premature, progressive ASCVD, DM, CKD, HTN LDL-C goal:<55  ApoB goal: <70  Diet:  Breakfast: boil egg, apple sause, french toast, omelette, does not always eat, oatmeal Lunch: sandwich, PB and jelly, soup (vegetable) Dinner: mashed potatoes, greens (uses [place]) , peas, chicken, [place]  Fried fish- 2-3 times a month Fried chicken 2-3 times a month   Exercise: walks almost daily- go to stores and walks 30 min or more  Family History:  Family History  Problem Relation Age of Onset   Diabetes Mother    Diabetes Father    Breast cancer Sister 87   Diabetes Sister    Breast cancer Sister 62   Diabetes Sister    Diabetes Sister    Diabetes Sister    High blood pressure Daughter    Breast cancer Maternal Aunt 49   Heart attack Brother     Social History: no tobacco, no ETOH, no illicit drugs  Labs: Lipid Panel  08/06/23 TC 167, TG 62, HDL 65, LDL-C 90 (rosuvastatin  40 mg daily)     Component Value Date/Time   CHOL 152 10/27/2021 0830   TRIG 64 10/27/2021 0830   HDL 60 10/27/2021 0830   CHOLHDL 2.5  10/27/2021 0830   LDLCALC 79 10/27/2021 0830   LABVLDL 13 10/27/2021 0830    Past Medical History:  Diagnosis Date   A-fib (HCC)    Anginal pain (HCC)    Anxiety    Aortic atherosclerosis (HCC)    Arthritis    Back pain    Bradycardia    CAD (coronary artery disease)    Caregiver stress    CHF (congestive heart failure) (HCC)    CKD (chronic kidney disease)    Coagulation defect (HCC)    Coronary artery disease    Depression    Diabetes mellitus without complication (HCC)    GERD (gastroesophageal reflux disease)    Hair loss    High cholesterol    History of DVT (deep vein thrombosis)    History of tobacco use    Hypertension    Insomnia    Long term current use of oral hypoglycemic drug    Lower leg edema    Memory loss    Morbid obesity (HCC)    Myocardial infarction (HCC)    " MILD "  IN 1997   Nausea and vomiting    Neuromuscular disorder (HCC)    DIABETIC NEUROPATHY   OSA (obstructive sleep apnea)    Periumbilical abdominal pain    Shortness of  breath    Type 2 diabetes mellitus with both eyes affected by mild nonproliferative retinopathy without macular edema, without long-term current use of insulin  (HCC)    Type 2 diabetes mellitus with microalbuminuria (HCC)    Type 2 diabetes mellitus with stage 4 chronic kidney disease (HCC)    Vitamin D deficiency     Current Outpatient Medications on File Prior to Visit  Medication Sig Dispense Refill   allopurinol (ZYLOPRIM) 100 MG tablet Take 100 mg by mouth every morning.     amLODipine (NORVASC) 10 MG tablet Take 10 mg by mouth daily.     apixaban  (ELIQUIS ) 5 MG TABS tablet Take 5 mg by mouth 2 (two) times daily.     Cholecalciferol (VITAMIN D3) 1.25 MG (50000 UT) CAPS Take 1 capsule by mouth once a week.     furosemide (LASIX) 20 MG tablet Take 40 mg by mouth 2 (two) times daily.     isosorbide mononitrate (IMDUR) 120 MG 24 hr tablet Take 120 mg by mouth every morning.     levothyroxine (SYNTHROID) 25 MCG  tablet Take 25 mcg by mouth every morning.     metoprolol  tartrate (LOPRESSOR ) 50 MG tablet Take 1 tablet (50 mg total) by mouth 2 (two) times daily. 180 tablet 3   MOUNJARO 5 MG/0.5ML Pen Inject 5 mg into the skin once a week.     Multiple Vitamin (MULTI-VITAMIN DAILY PO) Take 1 tablet by mouth daily.     nitroGLYCERIN  (NITROSTAT ) 0.4 MG SL tablet Place 0.4 mg under the tongue every 5 (five) minutes as needed for chest pain.     olmesartan (BENICAR) 40 MG tablet Take 40 mg by mouth daily.     rosuvastatin  (CRESTOR ) 40 MG tablet TAKE 1 TABLET BY MOUTH EVERY DAY 15 tablet 0   acetaminophen  (TYLENOL ) 500 MG tablet Take 1 tablet (500 mg total) by mouth every 6 (six) hours as needed. 30 tablet 0   Blood Glucose Monitoring Suppl (ONETOUCH VERIO) w/Device KIT Use 1-4 times daily as needed/directed DX E11.9 1 kit 0   glucose blood (ONETOUCH VERIO) test strip 1 each by Other route 2 (two) times daily. And lancets 2/day 200 strip 3   Lancets (ONETOUCH ULTRASOFT) lancets Use 1-4 times daily as needed/directed  DX E11.9 200 each 12   ranolazine  (RANEXA ) 500 MG 12 hr tablet Take 1 tablet (500 mg total) by mouth daily. (Patient not taking: Reported on 10/27/2023) 90 tablet 3   ULTRAM 50 MG tablet Take 50 mg by mouth every 6 (six) hours. (Patient not taking: Reported on 10/27/2023)     No current facility-administered medications on file prior to visit.    No Known Allergies  Assessment/Plan:  1. Hyperlipidemia -  Hyperlipidemia Assessment: LDL-C is above goal of less than 55 due to premature and progressive ASCVD Patient has strong family history.  Confirmed her children have had their risk factors monitored Reviewed PCSK9 Injection technique reviewed.  Patient takes Mounjaro and is comfortable with injections She does have SNP and her cost of medications are reduced.  She states that this time all her medications are free  Plan: Will submit prior authorization for Repatha Patient would like filled at  [facility] pharmacy Continue rosuvastatin  40 mg daily Repeat labs in 3 months Handouts on proper nutrition provided to patient    Thank you,  Oreoluwa Aigner D Riyansh Gerstner, Pharm.Monika Annas, CPP [facility] of [facility] [address], [place], [place] [address]  Phone: [phone]; Fax: [phone]

## 2023-10-27 NOTE — Telephone Encounter (Signed)
 Pharmacy Patient Advocate Encounter   Received notification from Physician's Office that prior authorization for REPATHA is required/requested.   Insurance verification completed.   The patient is insured through [employer] .   Per test claim: PA required; PA submitted to above mentioned insurance via CoverMyMeds Key/confirmation #/EOC [id number] Status is pending

## 2023-10-28 ENCOUNTER — Encounter: Payer: Self-pay | Admitting: Pharmacist

## 2023-10-28 ENCOUNTER — Other Ambulatory Visit (HOSPITAL_COMMUNITY): Payer: Self-pay

## 2023-10-28 MED ORDER — REPATHA SURECLICK 140 MG/ML ~~LOC~~ SOAJ
1.0000 mL | SUBCUTANEOUS | 3 refills | Status: AC
  Filled 2023-10-28: qty 6, 84d supply, fill #0
  Filled 2024-01-10: qty 6, 84d supply, fill #1
  Filled 2024-04-19: qty 6, 84d supply, fill #2
  Filled 2024-07-19: qty 6, 84d supply, fill #3

## 2023-10-28 NOTE — Telephone Encounter (Signed)
 Pharmacy Patient Advocate Encounter  Received notification from [facility] that Prior Authorization for REPATHA has been APPROVED from 10/28/23 to 04/29/24. Ran test claim, Copay is $0. This test claim was processed through [facility]- copay amounts may vary at other pharmacies due to pharmacy/plan contracts, or as the patient moves through the different stages of their insurance plan.

## 2023-10-28 NOTE — Addendum Note (Signed)
 Addended by: Berish Bohman D on: 10/28/2023 01:02 PM   Modules accepted: Orders

## 2023-10-29 ENCOUNTER — Other Ambulatory Visit (HOSPITAL_COMMUNITY): Payer: Self-pay

## 2023-10-29 ENCOUNTER — Ambulatory Visit: Attending: Cardiology | Admitting: Cardiology

## 2023-10-29 VITALS — BP 102/66 | HR 96 | Resp 16 | Ht 65.0 in | Wt 211.4 lb

## 2023-10-29 DIAGNOSIS — Z951 Presence of aortocoronary bypass graft: Secondary | ICD-10-CM | POA: Diagnosis not present

## 2023-10-29 DIAGNOSIS — I4811 Longstanding persistent atrial fibrillation: Secondary | ICD-10-CM

## 2023-10-29 DIAGNOSIS — Z955 Presence of coronary angioplasty implant and graft: Secondary | ICD-10-CM | POA: Diagnosis not present

## 2023-10-29 DIAGNOSIS — I10 Essential (primary) hypertension: Secondary | ICD-10-CM

## 2023-10-29 DIAGNOSIS — N184 Chronic kidney disease, stage 4 (severe): Secondary | ICD-10-CM

## 2023-10-29 DIAGNOSIS — I25118 Atherosclerotic heart disease of native coronary artery with other forms of angina pectoris: Secondary | ICD-10-CM

## 2023-10-29 DIAGNOSIS — E1159 Type 2 diabetes mellitus with other circulatory complications: Secondary | ICD-10-CM

## 2023-10-29 DIAGNOSIS — E782 Mixed hyperlipidemia: Secondary | ICD-10-CM

## 2023-10-29 MED ORDER — FUROSEMIDE 20 MG PO TABS
20.0000 mg | ORAL_TABLET | Freq: Two times a day (BID) | ORAL | 3 refills | Status: DC
  Filled 2023-10-29: qty 180, 90d supply, fill #0
  Filled 2024-01-10: qty 180, 90d supply, fill #1
  Filled 2024-05-05: qty 180, 90d supply, fill #2

## 2023-10-29 MED ORDER — RANOLAZINE ER 500 MG PO TB12
500.0000 mg | ORAL_TABLET | Freq: Every day | ORAL | 3 refills | Status: AC
  Filled 2023-10-29: qty 90, 90d supply, fill #0
  Filled 2024-01-10: qty 90, 90d supply, fill #1
  Filled 2024-05-05: qty 90, 90d supply, fill #2

## 2023-10-29 NOTE — Patient Instructions (Signed)
 Medication Instructions:  Decrease Lasix to 20 mg twice daily Refilled Ranexa  *If you need a refill on your cardiac medications before your next appointment, please call your pharmacy*  Lab Work: BNP and BMP in 1 week If you have labs (blood work) drawn today and your tests are completely normal, you will receive your results only by: MyChart Message (if you have MyChart) OR A paper copy in the mail If you have any lab test that is abnormal or we need to change your treatment, we will call you to review the results.  Testing/Procedures: NONE  Follow-Up: At [facility], you and your health needs are our priority.  As part of our continuing mission to provide you with exceptional heart care, our providers are all part of one team.  This team includes your primary Cardiologist (physician) and Advanced Practice Providers or APPs (Physician Assistants and Nurse Practitioners) who all work together to provide you with the care you need, when you need it.  Your next appointment:   3 month(s)  Provider:   Albert Huff, DO  We recommend signing up for the patient portal called "MyChart".  Sign up information is provided on this After Visit Summary.  MyChart is used to connect with patients for Virtual Visits (Telemedicine).  Patients are able to view lab/test results, encounter notes, upcoming appointments, etc.  Non-urgent messages can be sent to your provider as well.   To learn more about what you can do with MyChart, go to [email or web address].   Other Instructions

## 2023-10-29 NOTE — Progress Notes (Unsigned)
 Cardiology Office Note:  .   Date:  10/29/2023  ID:  Diane Steele, DOB January 19, 1961, MRN [id number] PCP:  Shannan Dart., FNP  Former Cardiology Providers: Dr. Glena Landau, Dr. Avery Bodo [facility] Providers Cardiologist:  Olinda Bertrand, DO , [facility] (established care 09/02/23) Electrophysiologist:  None  Click to update primary MD,subspecialty MD or APP then REFRESH:1}    Chief Complaint  Patient presents with   Coronary artery disease involving native coronary artery of   Results   Follow-up    History of Present Illness: .   Diane Steele is a 63 y.o. African-American female whose past medical history and cardiovascular risk factors includes: Coronary artery disease status post surgical revascularization in 1997 at the age of 63 (LIMA to the LAD, SVG to RCA, SVG to the first diagonal, SVG to OM 2), multiple PCI's, family history of premature CAD, hypertension, heart failure, atrial fibrillation (diagnosed in 2014), Aortic atherosclerosis.  I reestablish care with her back in March 2025 as she was formally under the care of Dr. Avery Bodo.  In 1997 patient underwent PTCA of the SVG to RCA.  In 2004 she underwent heart catheterization and had a 3.5 x 24 Taxus stent to SVG to the first diagonal branch and later in 2024 she underwent placement of stent to the proximal to distal circumflex.  There were 2 overlapping stents 3.75 x 33 Cypher and 3 x 38 mm stent placement as well (please see prior documentation).  In 2005 patient had severe three-vessel disease with patent stents in the circumflex, SVG to OM 2 patent, patent SVG to diagonal branch.  And SVG to the RCA.  In the SVG to the distal RCA there was concern for only TIMI II flow.  This was not suitable for any interventions per report.  In 2014 patient was diagnosed with atrial fibrillation and started on amiodarone  as well as Eliquis  for thromboembolic prophylaxis.  Patient informs me that in the past other providers  are recommended rate control strategy.  No prior history of direct-current cardioversion or atrial fibrillation ablation.  When she presented to the office back in March 2025 she was experiencing chest pain which was felt to be cardiac based on symptoms.  She underwent cardiac PET/CT which notes reversible ischemia and high risk study here to discuss results.  Patient states that she continues to have similar chest pain but overall intensity and frequency and duration has improved.  At the last office visit she was started on Ranexa  but was not able to get it filled.  Patient was also started on Repatha  which she is yet to pick up as well.  Review of Systems: .   Review of Systems  Cardiovascular:  Positive for chest pain (Chronic and stable) and dyspnea on exertion (Chronic and stable). Negative for claudication, irregular heartbeat, leg swelling, near-syncope, orthopnea, palpitations, paroxysmal nocturnal dyspnea and syncope.  Respiratory:  Positive for shortness of breath.   Hematologic/Lymphatic: Negative for bleeding problem.    Studies Reviewed:   Echocardiogram: 07/2021: LVEF 55 to 60%, no regional wall motion abnormalities, RV function stable, RV size mildly dilated, severely biatrial dilatation, mild to moderate mitral regurgitation, no evidence of mitral stenosis estimated RAP 3 mmHg.  Stress Testing: January 2023 Intermediate risk study.  Findings are equivocal, per report. LVEF moderately reduced-per gated SPECT 30-44%, end-diastolic cavity size is normal, no significant evidence of inducible ischemia, inferoseptal wall appears hypokinetic but no obvious scar on rest or stress images.  Cardiac PET/CT: 10/20/2023  Findings are consistent with LCX territory ischemia (with associated stress flow of 1.01). The study is high risk; though blood flow reserve may be less accurate post bypass, and LVEF reserve (-6%) may be less accurate in atrial fibrillation; the constellation of these  with ischemia suggests high risk phenotype.  LV perfusion is abnormal. There is evidence of ischemia. Defect 1: There is a medium defect with moderate reduction in uptake present in the mid to basal lateral location(s) that is reversible. There is abnormal wall motion in the defect area. Consistent with ischemia; < 10% burden of myocardium.  Rest left ventricular function is abnormal. Rest global function is mildly reduced. Rest EF: 45%. Stress left ventricular function is abnormal. Stress global function is moderately reduced. Stress EF: 39%. End diastolic cavity size is normal. End systolic cavity size is normal. Imaged in atrial fibrillation with PVCs which may reduce accuracy of findings.  Coronary calcium  assessment not performed due to prior revascularization. SVG course not well visualized in this study.   RADIOLOGY: NA  Risk Assessment/Calculations:   Click Here to Calculate/Change CHADS2VASc Score The patient's CHADS2-VASc score is 5, indicating a 7.2% annual risk of stroke.  CHF History: Yes HTN History: Yes Diabetes History: Yes Stroke History: No Vascular Disease History: Yes  Labs:       Latest Ref Rng & Units 09/28/2022    3:45 PM 10/27/2021    8:30 AM 07/19/2016   11:20 AM  CBC  WBC 4.0 - 10.5 K/uL 6.5  6.3  5.2   Hemoglobin 12.0 - 15.0 g/dL 96.0  45.4  9.5   Hematocrit 36.0 - 46.0 % 32.9  34.2  30.6   Platelets 150 - 400 K/uL 220  224  202        Latest Ref Rng & Units 09/28/2022    3:45 PM 10/27/2021    8:30 AM 01/22/2020   12:00 AM  BMP  Glucose 70 - 99 mg/dL 098  119    BUN 8 - 23 mg/dL 43  65  48      Creatinine 0.44 - 1.00 mg/dL 1.47  8.29  2.2      BUN/Creat Ratio 12 - 28  28    Sodium 135 - 145 mmol/L 139  139  135      Potassium 3.5 - 5.1 mmol/L 3.4  4.6  5.3      Chloride 98 - 111 mmol/L 106  106  108      CO2 22 - 32 mmol/L 21  16  22       Calcium  8.9 - 10.3 mg/dL 9.5  9.8  9.6         This result is from an external source.      Latest Ref Rng &  Units 09/28/2022    3:45 PM 10/27/2021    8:30 AM 01/22/2020   12:00 AM  CMP  Glucose 70 - 99 mg/dL 562  130    BUN 8 - 23 mg/dL 43  65  48      Creatinine 0.44 - 1.00 mg/dL 8.65  7.84  2.2      Sodium 135 - 145 mmol/L 139  139  135      Potassium 3.5 - 5.1 mmol/L 3.4  4.6  5.3      Chloride 98 - 111 mmol/L 106  106  108      CO2 22 - 32 mmol/L 21  16  22  Calcium  8.9 - 10.3 mg/dL 9.5  9.8  9.6      Total Protein 6.0 - 8.5 g/dL  7.2    Total Bilirubin 0.0 - 1.2 mg/dL  0.5    Alkaline Phos 44 - 121 IU/L  81  111      AST 0 - 40 IU/L  15  16      ALT 0 - 32 IU/L  17  32         This result is from an external source.    Lab Results  Component Value Date   CHOL 152 10/27/2021   HDL 60 10/27/2021   LDLCALC 79 10/27/2021   TRIG 64 10/27/2021   CHOLHDL 2.5 10/27/2021   No results for input(s): "LIPOA" in the last 8760 hours. No components found for: "NTPROBNP" No results for input(s): "PROBNP" in the last 8760 hours. No results for input(s): "TSH" in the last 8760 hours.  External Labs: Collected: 08/05/2023 [facility] database. Total cholesterol 167, triglycerides 62, HDL 65, LDL 90, A1c 6.1.  TSH 8.3  Physical Exam:    Today's Vitals   10/29/23 0804  BP: 102/66  Pulse: 96  Resp: 16  SpO2: 98%  Weight: 211 lb 6.4 oz (95.9 kg)  Height: 5\' 5"  (1.651 m)   Body mass index is 35.18 kg/m. Wt Readings from Last 3 Encounters:  10/29/23 211 lb 6.4 oz (95.9 kg)  09/02/23 213 lb (96.6 kg)  10/21/21 253 lb (114.8 kg)    Physical Exam  Constitutional: No distress.  hemodynamically stable  Neck: No JVD present.  Cardiovascular: Normal rate, regular rhythm, S1 normal and S2 normal. Exam reveals no gallop, no S3 and no S4.  No murmur heard. Pulmonary/Chest: Effort normal and breath sounds normal. No stridor. She has no wheezes. She has no rales.  Musculoskeletal:        General: No edema.     Cervical back: Neck supple.  Skin: Skin is warm.    Impression & Recommendation(s):   Impression:   ICD-10-CM   1. CKD (chronic kidney disease) stage 4, GFR 15-29 ml/min (HCC)  N18.4 Basic metabolic panel with GFR    Pro b natriuretic peptide (BNP)    Pro b natriuretic peptide (BNP)    Basic metabolic panel with GFR    2. Coronary artery disease involving native coronary artery of native heart with other form of angina pectoris (HCC)  I25.118         Recommendation(s):  Coronary artery disease involving native coronary artery of native heart with other form of angina pectoris (HCC) Hx of CABG History of coronary artery stent placement Patient has had a history of premature coronary artery disease with surgical revascularization at the age of 71 with Dr. Nicanor Barge. She presents today not only to reestablish care but has been experiencing anginal chest pain. No active chest pain at the time of the evaluation. EKG illustrates sinus rhythm without underlying ischemia or injury pattern. Symptoms ongoing for the last 1 month at least. Antianginal therapies include: Norvasc 10 mg p.o. daily, Imdur 60 mg p.o. daily, Lopressor  25 mg p.o. twice daily, and as needed nitroglycerin  Echo will be ordered to evaluate for structural heart disease and left ventricular systolic function. Given her symptoms she definitely needs to be evaluated for progression of coronary artery disease.  Ideal test of choice would be a left heart catheterization with possible intervention.  However given her chronic kidney disease and her increased risk for contrast-induced nephropathy this shared  decision was to proceed forward with a noninvasive strategy and up titration of antianginal therapy.  However, if the symptoms increase in intensity frequency or duration patient understands that she needs to go to the closest ER via EMS for further evaluation and management. Cardiac PET/CT to evaluate for reversible ischemia. Will increase Lopressor  from 25 mg p.o. twice daily to 50 mg p.o. twice daily. Start Ranexa   500 mg p.o. daily Currently on high intensity statin therapy tolerated Crestor  40 mg p.o. daily.  LDL is not at goal. Given her CABG at a relatively young age followed by multiple coronary interventions recommend a goal LDL <55 mg/dL.  Will refer her to Pharm.D. for initiation of PCSK9 inhibitor versus Leqvio based on coverage  Type 2 diabetes mellitus with other circulatory complication, without long-term current use of insulin  (HCC) Reemphasized importance of glycemic control. Most recent hemoglobin A1c is a February 2025 is well-controlled at 6.1 She follows up with Dr. Sherline Distel  Mixed hyperlipidemia Continue rosuvastatin  40 mg p.o. daily. Does not endorse myalgias. Lipid profile from February 2025 illustrates an LDL level of 90 mg/dL. As mentioned above recommending a goal LDL <55 mg/dL given her CAD, CABG, and multiple PCI's. Refer to Pharm.D. for evaluation for PCSK9 inhibitors versus in Leqvio  Essential hypertension Office blood pressures are very well-controlled. As mentioned above will increase the dose of Lopressor  for now, will titrate to Toprol -XL at the next visit Addition of Ranexa  as discussed above. Continue amlodipine 10 mg p.o. daily. Continue Lasix 20 mg 2 tabs twice daily, will defer diuretic management to nephrology. Continue olmesartan 40 mg p.o. daily  CKD (chronic kidney disease) stage 4, GFR 15-29 ml/min (HCC) Medications as discussed above. Does follow-up with nephrology.  Longstanding persistent atrial fibrillation (HCC) Appears to have long persistent atrial fibrillation. Currently managed with rate control strategy. Last echocardiogram notes biatrial enlargement. Remote history of being on amiodarone . Will focus on completing the ischemic evaluation for now and will discuss A-fib management at the follow-up visits.  Overall I suspect she will likely need medical therapy given the size of her biatrial enlargement. Patient does not endorse evidence of  bleeding. Risks, benefits, alternatives to oral anticoagulation discussed.   Orders Placed:  Orders Placed This Encounter  Procedures   Basic metabolic panel with GFR    Standing Status:   Future    Number of Occurrences:   1    Expected Date:   11/05/2023    Expiration Date:   10/28/2024   Pro b natriuretic peptide (BNP)    Standing Status:   Future    Number of Occurrences:   1    Expected Date:   11/05/2023    Expiration Date:   10/28/2024   Final Medication List:    Meds ordered this encounter  Medications   ranolazine  (RANEXA ) 500 MG 12 hr tablet    Sig: Take 1 tablet (500 mg total) by mouth daily.    Dispense:  90 tablet    Refill:  3   furosemide (LASIX) 20 MG tablet    Sig: Take 1 tablet (20 mg total) by mouth 2 (two) times daily.    Dispense:  180 tablet    Refill:  3    Medications Discontinued During This Encounter  Medication Reason   furosemide (LASIX) 20 MG tablet Dose change   ranolazine  (RANEXA ) 500 MG 12 hr tablet Reorder      Current Outpatient Medications:    acetaminophen  (TYLENOL ) 500 MG tablet, Take 1  tablet (500 mg total) by mouth every 6 (six) hours as needed., Disp: 30 tablet, Rfl: 0   allopurinol (ZYLOPRIM) 100 MG tablet, Take 100 mg by mouth every morning., Disp: , Rfl:    amLODipine (NORVASC) 10 MG tablet, Take 10 mg by mouth daily., Disp: , Rfl:    apixaban  (ELIQUIS ) 5 MG TABS tablet, Take 5 mg by mouth 2 (two) times daily., Disp: , Rfl:    Blood Glucose Monitoring Suppl (ONETOUCH VERIO) w/Device KIT, Use 1-4 times daily as needed/directed DX E11.9, Disp: 1 kit, Rfl: 0   Cholecalciferol (VITAMIN D3) 1.25 MG (50000 UT) CAPS, Take 1 capsule by mouth once a week., Disp: , Rfl:    Evolocumab  (REPATHA  SURECLICK) 140 MG/ML SOAJ, Inject 140 mg into the skin every 14 (fourteen) days., Disp: 6 mL, Rfl: 3   furosemide (LASIX) 20 MG tablet, Take 1 tablet (20 mg total) by mouth 2 (two) times daily., Disp: 180 tablet, Rfl: 3   glucose blood (ONETOUCH VERIO)  test strip, 1 each by Other route 2 (two) times daily. And lancets 2/day, Disp: 200 strip, Rfl: 3   isosorbide mononitrate (IMDUR) 120 MG 24 hr tablet, Take 120 mg by mouth every morning., Disp: , Rfl:    Lancets (ONETOUCH ULTRASOFT) lancets, Use 1-4 times daily as needed/directed  DX E11.9, Disp: 200 each, Rfl: 12   levothyroxine (SYNTHROID) 25 MCG tablet, Take 25 mcg by mouth every morning., Disp: , Rfl:    metoprolol  tartrate (LOPRESSOR ) 50 MG tablet, Take 1 tablet (50 mg total) by mouth 2 (two) times daily., Disp: 180 tablet, Rfl: 3   MOUNJARO 5 MG/0.5ML Pen, Inject 5 mg into the skin once a week., Disp: , Rfl:    Multiple Vitamin (MULTI-VITAMIN DAILY PO), Take 1 tablet by mouth daily., Disp: , Rfl:    nitroGLYCERIN  (NITROSTAT ) 0.4 MG SL tablet, Place 0.4 mg under the tongue every 5 (five) minutes as needed for chest pain., Disp: , Rfl:    olmesartan (BENICAR) 40 MG tablet, Take 40 mg by mouth daily., Disp: , Rfl:    rosuvastatin  (CRESTOR ) 40 MG tablet, TAKE 1 TABLET BY MOUTH EVERY DAY, Disp: 15 tablet, Rfl: 0   ULTRAM 50 MG tablet, Take 50 mg by mouth every 6 (six) hours., Disp: , Rfl:    ranolazine  (RANEXA ) 500 MG 12 hr tablet, Take 1 tablet (500 mg total) by mouth daily., Disp: 90 tablet, Rfl: 3  Consent:   Informed Consent   Shared Decision Making/Informed Consent The risks [chest pain, shortness of breath, cardiac arrhythmias, dizziness, blood pressure fluctuations, myocardial infarction, stroke/transient ischemic attack, nausea, vomiting, allergic reaction, radiation exposure, metallic taste sensation and life-threatening complications (estimated to be 1 in 10,000)], benefits (risk stratification, diagnosing coronary artery disease, treatment guidance) and alternatives of a cardiac PET stress test were discussed in detail with Ms. Mcbain and she agrees to proceed.     Disposition:   8 weeks sooner if needed  Her questions and concerns were addressed to her satisfaction. She voices  understanding of the recommendations provided during this encounter.    Signed, Olinda Bertrand, DO, [facility]  [facility]  [address] [place], [place] [address] 10/29/2023 11:39 PM

## 2023-10-30 ENCOUNTER — Encounter: Payer: Self-pay | Admitting: Cardiology

## 2023-11-09 ENCOUNTER — Ambulatory Visit: Payer: Self-pay

## 2023-11-09 LAB — BASIC METABOLIC PANEL WITH GFR
BUN/Creatinine Ratio: 19 (ref 12–28)
BUN: 60 mg/dL — ABNORMAL HIGH (ref 8–27)
CO2: 18 mmol/L — ABNORMAL LOW (ref 20–29)
Calcium: 9.6 mg/dL (ref 8.7–10.3)
Chloride: 103 mmol/L (ref 96–106)
Creatinine, Ser: 3.2 mg/dL — ABNORMAL HIGH (ref 0.57–1.00)
Glucose: 115 mg/dL — ABNORMAL HIGH (ref 70–99)
Potassium: 5.2 mmol/L (ref 3.5–5.2)
Sodium: 135 mmol/L (ref 134–144)
eGFR: 16 mL/min/{1.73_m2} — ABNORMAL LOW (ref >59)

## 2023-11-09 LAB — PRO B NATRIURETIC PEPTIDE: NT-Pro BNP: 1119 pg/mL — ABNORMAL HIGH (ref 0–287)

## 2023-11-23 ENCOUNTER — Other Ambulatory Visit: Payer: Self-pay | Admitting: Family

## 2023-11-23 ENCOUNTER — Other Ambulatory Visit (HOSPITAL_COMMUNITY)

## 2023-11-23 DIAGNOSIS — Z1231 Encounter for screening mammogram for malignant neoplasm of breast: Secondary | ICD-10-CM

## 2023-12-01 ENCOUNTER — Ambulatory Visit
Admission: RE | Admit: 2023-12-01 | Discharge: 2023-12-01 | Disposition: A | Discharge status: Home / Self Care | Source: Ambulatory Visit | Attending: Family | Admitting: Family

## 2023-12-01 DIAGNOSIS — Z1231 Encounter for screening mammogram for malignant neoplasm of breast: Secondary | ICD-10-CM

## 2024-01-10 ENCOUNTER — Other Ambulatory Visit: Payer: Self-pay

## 2024-01-10 ENCOUNTER — Other Ambulatory Visit (HOSPITAL_COMMUNITY): Payer: Self-pay

## 2024-01-31 ENCOUNTER — Encounter: Payer: Self-pay | Admitting: Cardiology

## 2024-01-31 ENCOUNTER — Ambulatory Visit: Attending: Cardiology | Admitting: Cardiology

## 2024-01-31 VITALS — BP 124/80 | HR 55 | Resp 16 | Ht 65.0 in | Wt 209.4 lb

## 2024-01-31 DIAGNOSIS — Z955 Presence of coronary angioplasty implant and graft: Secondary | ICD-10-CM

## 2024-01-31 DIAGNOSIS — I25118 Atherosclerotic heart disease of native coronary artery with other forms of angina pectoris: Secondary | ICD-10-CM | POA: Diagnosis not present

## 2024-01-31 DIAGNOSIS — I10 Essential (primary) hypertension: Secondary | ICD-10-CM

## 2024-01-31 DIAGNOSIS — I4811 Longstanding persistent atrial fibrillation: Secondary | ICD-10-CM | POA: Diagnosis not present

## 2024-01-31 DIAGNOSIS — E1159 Type 2 diabetes mellitus with other circulatory complications: Secondary | ICD-10-CM

## 2024-01-31 DIAGNOSIS — E782 Mixed hyperlipidemia: Secondary | ICD-10-CM

## 2024-01-31 DIAGNOSIS — Z951 Presence of aortocoronary bypass graft: Secondary | ICD-10-CM | POA: Diagnosis not present

## 2024-01-31 DIAGNOSIS — N184 Chronic kidney disease, stage 4 (severe): Secondary | ICD-10-CM

## 2024-01-31 LAB — COMPREHENSIVE METABOLIC PANEL WITH GFR
ALT: 24 IU/L (ref 0–32)
AST: 26 IU/L (ref 0–40)
Albumin: 4.2 g/dL (ref 3.9–4.9)
Alkaline Phosphatase: 87 IU/L (ref 44–121)
BUN/Creatinine Ratio: 17 (ref 12–28)
BUN: 45 mg/dL — ABNORMAL HIGH (ref 8–27)
Bilirubin Total: 0.5 mg/dL (ref 0.0–1.2)
CO2: 16 mmol/L — ABNORMAL LOW (ref 20–29)
Calcium: 9.6 mg/dL (ref 8.7–10.3)
Chloride: 111 mmol/L — ABNORMAL HIGH (ref 96–106)
Creatinine, Ser: 2.58 mg/dL — ABNORMAL HIGH (ref 0.57–1.00)
Globulin, Total: 2.4 g/dL (ref 1.5–4.5)
Glucose: 105 mg/dL — ABNORMAL HIGH (ref 70–99)
Potassium: 5.5 mmol/L — ABNORMAL HIGH (ref 3.5–5.2)
Sodium: 141 mmol/L (ref 134–144)
Total Protein: 6.6 g/dL (ref 6.0–8.5)
eGFR: 20 mL/min/1.73 — ABNORMAL LOW (ref >59)

## 2024-01-31 LAB — LIPID PANEL
Chol/HDL Ratio: 1.6 ratio (ref 0.0–4.4)
Cholesterol, Total: 102 mg/dL (ref 100–199)
HDL: 62 mg/dL (ref >39)
LDL Chol Calc (NIH): 28 mg/dL (ref 0–99)
Triglycerides: 44 mg/dL (ref 0–149)
VLDL Cholesterol Cal: 12 mg/dL (ref 5–40)

## 2024-01-31 NOTE — Progress Notes (Signed)
 Cardiology Office Note:  .    ID:  Diane Steele, DOB 1960/06/26, MRN [id number] PCP:  Diane Prentice Diane Steele., FNP  Former Cardiology Providers: Dr. Levern, Dr. Candyce [facility] Providers Cardiologist:  Diane Large, DO , [facility] (established care 09/02/23) Electrophysiologist:  None  Click to update primary MD,subspecialty MD or APP then REFRESH:1}    Chief Complaint  Patient presents with   Coronary artery disease involving native coronary artery of   Follow-up    History of Present Illness: .   Diane Steele is a 63 y.o. African-American female whose past medical history and cardiovascular risk factors includes: Coronary artery disease status post surgical revascularization in 1997 at the age of 62 (LIMA to the LAD, SVG to RCA, SVG to the first diagonal, SVG to OM 2), multiple PCI's, family history of premature CAD, hypertension, heart failure, atrial fibrillation (diagnosed in 2014), Aortic atherosclerosis.  I reestablish care with her back in March 2025 as she was formally under the care of Dr. Candyce Steele.  In 1997 patient underwent PTCA of the SVG to RCA.  In 2004 she underwent heart catheterization and had a 3.5 x 24 Taxus stent to SVG to the first diagonal branch and later in 2024 she underwent placement of stent to the proximal to distal circumflex.  There were 2 overlapping stents 3.75 x 33 Cypher and 3 x 38 mm stent placement as well (please see prior documentation).  In 2005 patient had severe three-vessel disease with patent stents in the circumflex, SVG to OM 2 patent, patent SVG to diagonal branch.  And SVG to the RCA.  In the SVG to the distal RCA there was concern for only TIMI II flow.  This was not suitable for any interventions per report.  In 2014 patient was diagnosed with atrial fibrillation and started on amiodarone  as well as Eliquis  for thromboembolic prophylaxis.  Patient informs me that in the past other providers are recommended rate  control strategy.  No prior history of direct-current cardioversion or atrial fibrillation ablation.  In March 2025 she was experiencing chest pain which was felt to be cardiac based on symptoms.  She underwent cardiac PET/CT which notes reversible ischemia and high risk study.  At her last office visit in May 2025 she had decision was to treat her medically given her chronic kidney disease stage IV.  Her antianginal therapies include but not limited to amlodipine/Imdur/metoprolol .  She was started on Ranexa  500 mg p.o. twice daily.  And referred to Pharm.D. clinic for initiation of Repatha .  She presents today for 21-month follow-up visit.  Also tried to down titrate her diuretics in the hopes to preserve renal function as she was euvolemic on examination.  Patient developed acute kidney injury and therefore was recommended to hold diuresis until she follows up with nephrology.  Last office visit patient denies anginal chest pain or heart failure symptoms.  Patient states that she feels that she has more energy as well.  She has been following up with nephrology as recommended and states that she is being referred to [facility] for possible renal transplant.   Review of Systems: .   Review of Systems  Cardiovascular:  Negative for chest pain, claudication, dyspnea on exertion, irregular heartbeat, leg swelling, near-syncope, orthopnea, palpitations, paroxysmal nocturnal dyspnea and syncope.  Respiratory:  Negative for shortness of breath.   Hematologic/Lymphatic: Negative for bleeding problem.    Studies Reviewed:   Echocardiogram: 07/2021: LVEF 55 to 60%, no regional  wall motion abnormalities, RV function stable, RV size mildly dilated, severely biatrial dilatation, mild to moderate mitral regurgitation, no evidence of mitral stenosis estimated RAP 3 mmHg.  Stress Testing: January 2023 Intermediate risk study.  Findings are equivocal, per report. LVEF moderately reduced-per gated SPECT 30-44%,  end-diastolic cavity size is normal, no significant evidence of inducible ischemia, inferoseptal wall appears hypokinetic but no obvious scar on rest or stress images.  Cardiac PET/CT: 10/20/2023  Findings are consistent with LCX territory ischemia (with associated stress flow of 1.01). The study is high risk; though blood flow reserve may be less accurate post bypass, and LVEF reserve (-6%) may be less accurate in atrial fibrillation; the constellation of these with ischemia suggests high risk phenotype.  LV perfusion is abnormal. There is evidence of ischemia. Defect 1: There is a medium defect with moderate reduction in uptake present in the mid to basal lateral location(s) that is reversible. There is abnormal wall motion in the defect area. Consistent with ischemia; < 10% burden of myocardium.  Rest left ventricular function is abnormal. Rest global function is mildly reduced. Rest EF: 45%. Stress left ventricular function is abnormal. Stress global function is moderately reduced. Stress EF: 39%. End diastolic cavity size is normal. End systolic cavity size is normal. Imaged in atrial fibrillation with PVCs which may reduce accuracy of findings.  Coronary calcium  assessment not performed due to prior revascularization. SVG course not well visualized in this study.   RADIOLOGY: NA  Risk Assessment/Calculations:   Click Here to Calculate/Change CHADS2VASc Score The patient's CHADS2-VASc score is 5, indicating a 7.2% annual risk of stroke.  CHF History: Yes HTN History: Yes Diabetes History: Yes Stroke History: No Vascular Disease History: Yes  Labs:       Latest Ref Rng & Units 09/28/2022    3:45 PM 10/27/2021    8:30 AM 07/19/2016   11:20 AM  CBC  WBC 4.0 - 10.5 K/uL 6.5  6.3  5.2   Hemoglobin 12.0 - 15.0 g/dL 89.9  88.8  9.5   Hematocrit 36.0 - 46.0 % 32.9  34.2  30.6   Platelets 150 - 400 K/uL 220  224  202        Latest Ref Rng & Units 11/08/2023   11:59 AM 09/28/2022    3:45  PM 10/27/2021    8:30 AM  BMP  Glucose 70 - 99 mg/dL 884  885  862   BUN 8 - 27 mg/dL 60  43  65   Creatinine 0.57 - 1.00 mg/dL 6.79  7.18  7.66   BUN/Creat Ratio 12 - 28 19   28    Sodium 134 - 144 mmol/L 135  139  139   Potassium 3.5 - 5.2 mmol/L 5.2  3.4  4.6   Chloride 96 - 106 mmol/L 103  106  106   CO2 20 - 29 mmol/L 18  21  16    Calcium  8.7 - 10.3 mg/dL 9.6  9.5  9.8       Latest Ref Rng & Units 11/08/2023   11:59 AM 09/28/2022    3:45 PM 10/27/2021    8:30 AM  CMP  Glucose 70 - 99 mg/dL 884  885  862   BUN 8 - 27 mg/dL 60  43  65   Creatinine 0.57 - 1.00 mg/dL 6.79  7.18  7.66   Sodium 134 - 144 mmol/L 135  139  139   Potassium 3.5 - 5.2 mmol/L 5.2  3.4  4.6  Chloride 96 - 106 mmol/L 103  106  106   CO2 20 - 29 mmol/L 18  21  16    Calcium  8.7 - 10.3 mg/dL 9.6  9.5  9.8   Total Protein 6.0 - 8.5 g/dL   7.2   Total Bilirubin 0.0 - 1.2 mg/dL   0.5   Alkaline Phos 44 - 121 IU/L   81   AST 0 - 40 IU/L   15   ALT 0 - 32 IU/L   17     Lab Results  Component Value Date   CHOL 152 10/27/2021   HDL 60 10/27/2021   LDLCALC 79 10/27/2021   TRIG 64 10/27/2021   CHOLHDL 2.5 10/27/2021   No results for input(s): LIPOA in the last 8760 hours. No components found for: NTPROBNP Recent Labs    11/08/23 1159  PROBNP 1,119*   No results for input(s): TSH in the last 8760 hours.  External Labs: Collected: 08/05/2023 [facility] database. Total cholesterol 167, triglycerides 62, HDL 65, LDL 90, A1c 6.1.  TSH 8.3  Physical Exam:    Today's Vitals   01/31/24 0847  BP: 124/80  Pulse: (!) 55  Resp: 16  SpO2: 99%  Weight: 209 lb 6.4 oz (95 kg)  Height: 5' 5 (1.651 m)   Body mass index is 34.85 kg/m. Wt Readings from Last 3 Encounters:  01/31/24 209 lb 6.4 oz (95 kg)  10/29/23 211 lb 6.4 oz (95.9 kg)  09/02/23 213 lb (96.6 kg)    Physical Exam  Constitutional: No distress.  hemodynamically stable  Neck: No JVD present.  Cardiovascular: Normal rate, regular rhythm, S1  normal and S2 normal. Exam reveals no gallop, no S3 and no S4.  No murmur heard. Pulmonary/Chest: Effort normal and breath sounds normal. No stridor. She has no wheezes. She has no rales.  Musculoskeletal:        General: No edema.     Cervical back: Neck supple.  Skin: Skin is warm.    Impression & Recommendation(s):  Impression:   ICD-10-CM   1. Coronary artery disease of native artery of native heart with stable angina pectoris (HCC)  I25.118     2. History of coronary artery stent placement  Z95.5     3. Hx of CABG  Z95.1     4. Longstanding persistent atrial fibrillation (HCC)  I48.11     5. Type 2 diabetes mellitus with other circulatory complication, without long-term current use of insulin  (HCC)  E11.59     6. Mixed hyperlipidemia  E78.2 Lipid panel    Comprehensive metabolic panel with GFR    Comprehensive metabolic panel with GFR    Lipid panel    7. Essential hypertension  I10 Comprehensive metabolic panel with GFR    Comprehensive metabolic panel with GFR    8. CKD (chronic kidney disease) stage 4, GFR 15-29 ml/min (HCC)  N18.4      Recommendation(s):  Coronary artery disease involving native coronary artery of native heart with stable angina pectoris (HCC) Hx of CABG History of coronary artery stent placement Chronic and stable Hx of premature coronary artery disease with surgical revascularization at the age of 71 with Dr. Army. Patient comes in today for 52-month follow-up visit after up titration of antianginal therapy Last office visit reviewed her recent cardiac PET/CT which was reported to be high risk due to reversible perfusion defect.  We discussed both invasive angiography +/- uptitration of antianginal therapy.  Given her chronic kidney disease stage  IV and the risk of contrast-induced nephropathy/progression of CKD patient wanted to uptitrate antianginal therapy and if refractory would be considered for invasive angiography. Antianginal therapies  include: Norvasc 10 mg p.o. daily, Imdur 60 mg p.o. daily, Lopressor  50 mg p.o. twice daily, and as needed nitroglycerin  Has done well with Ranexa . LDL not optimized despite being on maximum tolerated dose of high intensity statin, Crestor  40 mg p.o. daily.  Started on Repatha  since last office visit.  No follow-up fasting lipids or CMP. Will check fasting lipids today as she is fasting.   No active chest pain.   Educated her on seeking medical attention sooner by going to the closest ER via EMS if the symptoms increase in intensity, frequency, duration, or has typical chest pain as discussed in the office.  Patient verbalized understanding.  Type 2 diabetes mellitus with other circulatory complication, without long-term current use of insulin  (HCC) Reemphasized importance of glycemic control. Most recent hemoglobin A1c is a February 2025 is well-controlled at 6.1 She follows up with Dr. Marchia  Mixed hyperlipidemia Continue rosuvastatin  40 mg p.o. daily. Continue Repatha . Lipid profile from February 2025 illustrates an LDL level of 90 mg/dL. Recommend goal LDL <55 mg/dL given her CAD, CABG, and multiple PCI's. Check fasting lipids and CMP  Essential hypertension Office blood pressures are very well-controlled. Medications reconciled  CKD (chronic kidney disease) stage 4, GFR 15-29 ml/min (HCC) Medications as discussed above. Does follow-up with nephrology, states that she is been referred to [facility] for possible transplant evaluation  Longstanding persistent atrial fibrillation (HCC) Long persistent atrial fibrillation  Rate control: Metoprolol . Rhythm control: N/A. Thromboembolic prophylaxis: Eliquis   Does not endorse evidence of bleeding.   Antiarrhythmic history: Amiodarone    Orders Placed:  Orders Placed This Encounter  Procedures   Lipid panel    Standing Status:   Future    Number of Occurrences:   1    Expected Date:   01/31/2024    Expiration Date:   01/30/2025    Comprehensive metabolic panel with GFR    Standing Status:   Future    Number of Occurrences:   1    Expected Date:   01/31/2024    Expiration Date:   01/30/2025   Final Medication List:    No orders of the defined types were placed in this encounter.   There are no discontinued medications.     Current Outpatient Medications:    acetaminophen  (TYLENOL ) 500 MG tablet, Take 1 tablet (500 mg total) by mouth every 6 (six) hours as needed., Disp: 30 tablet, Rfl: 0   allopurinol (ZYLOPRIM) 100 MG tablet, Take 100 mg by mouth every morning., Disp: , Rfl:    amLODipine (NORVASC) 10 MG tablet, Take 10 mg by mouth daily., Disp: , Rfl:    apixaban  (ELIQUIS ) 5 MG TABS tablet, Take 5 mg by mouth 2 (two) times daily., Disp: , Rfl:    Blood Glucose Monitoring Suppl (ONETOUCH VERIO) w/Device KIT, Use 1-4 times daily as needed/directed DX E11.9, Disp: 1 kit, Rfl: 0   Cholecalciferol (VITAMIN D3) 1.25 MG (50000 UT) CAPS, Take 1 capsule by mouth once a week., Disp: , Rfl:    Evolocumab  (REPATHA  SURECLICK) 140 MG/ML SOAJ, Inject 140 mg into the skin every 14 (fourteen) days., Disp: 6 mL, Rfl: 3   furosemide  (LASIX ) 20 MG tablet, Take 1 tablet (20 mg total) by mouth 2 (two) times daily., Disp: 180 tablet, Rfl: 3   glucose blood (ONETOUCH VERIO) test strip, 1 each  by Other route 2 (two) times daily. And lancets 2/day, Disp: 200 strip, Rfl: 3   isosorbide mononitrate (IMDUR) 120 MG 24 hr tablet, Take 120 mg by mouth every morning., Disp: , Rfl:    Lancets (ONETOUCH ULTRASOFT) lancets, Use 1-4 times daily as needed/directed  DX E11.9, Disp: 200 each, Rfl: 12   levothyroxine (SYNTHROID) 25 MCG tablet, Take 25 mcg by mouth every morning., Disp: , Rfl:    metoprolol  tartrate (LOPRESSOR ) 50 MG tablet, Take 1 tablet (50 mg total) by mouth 2 (two) times daily., Disp: 180 tablet, Rfl: 3   MOUNJARO 5 MG/0.5ML Pen, Inject 5 mg into the skin once a week., Disp: , Rfl:    Multiple Vitamin (MULTI-VITAMIN DAILY PO), Take 1  tablet by mouth daily., Disp: , Rfl:    nitroGLYCERIN  (NITROSTAT ) 0.4 MG SL tablet, Place 0.4 mg under the tongue every 5 (five) minutes as needed for chest pain., Disp: , Rfl:    olmesartan (BENICAR) 40 MG tablet, Take 40 mg by mouth daily., Disp: , Rfl:    ranolazine  (RANEXA ) 500 MG 12 hr tablet, Take 1 tablet (500 mg total) by mouth daily., Disp: 90 tablet, Rfl: 3   rosuvastatin  (CRESTOR ) 40 MG tablet, TAKE 1 TABLET BY MOUTH EVERY DAY, Disp: 15 tablet, Rfl: 0   ULTRAM 50 MG tablet, Take 50 mg by mouth every 6 (six) hours., Disp: , Rfl:   Consent:   NA  Disposition:   6 months sooner if needed.  Her questions and concerns were addressed to her satisfaction. She voices understanding of the recommendations provided during this encounter.    Signed, Diane Large, DO, [facility] [place]  [facility]  01/31/2024 9:10 AM

## 2024-01-31 NOTE — Patient Instructions (Signed)
 Medication Instructions:  No medication changes were made at this visit. Continue current regimen.   *If you need a refill on your cardiac medications before your next appointment, please call your pharmacy*  Lab Work: To be completed today: FASTING lipid panel and CMP  If you have labs (blood work) drawn today and your tests are completely normal, you will receive your results only by: MyChart Message (if you have MyChart) OR A paper copy in the mail If you have any lab test that is abnormal or we need to change your treatment, we will call you to review the results.  Testing/Procedures: None ordered today.  Follow-Up: At [facility], you and your health needs are our priority.  As part of our continuing mission to provide you with exceptional heart care, our providers are all part of one team.  This team includes your primary Cardiologist (physician) and Advanced Practice Providers or APPs (Physician Assistants and Nurse Practitioners) who all work together to provide you with the care you need, when you need it.  Your next appointment:   6 month(s)  Provider:   Madonna Large, DO

## 2024-02-02 ENCOUNTER — Ambulatory Visit: Payer: Self-pay | Admitting: Cardiology

## 2024-02-02 DIAGNOSIS — E875 Hyperkalemia: Secondary | ICD-10-CM

## 2024-02-02 MED ORDER — LOKELMA 10 G PO PACK: 10.0000 g | PACK | Freq: Two times a day (BID) | ORAL | 0 refills | Status: AC

## 2024-02-02 NOTE — Telephone Encounter (Signed)
 Medication Samples have been provided to the patient.  Drug name: Lokelma        Strength: 10 g        Qty: 6  LOT: [id number]  Exp.Date: 11/19/2024  Dosing instructions: Take 1 packet (10 g) of Lokelma  twice daily for a total of 3 days.  The patient has been instructed regarding the correct time, dose, and frequency of taking this medication, including desired effects and most common side effects.   Ronal SQUIBB Moorea Boissonneault 6:16 PM 02/02/2024

## 2024-02-07 ENCOUNTER — Other Ambulatory Visit: Payer: Self-pay | Admitting: *Deleted

## 2024-02-07 DIAGNOSIS — E875 Hyperkalemia: Secondary | ICD-10-CM

## 2024-02-07 LAB — BASIC METABOLIC PANEL WITH GFR
BUN/Creatinine Ratio: 21 (ref 12–28)
BUN: 54 mg/dL — ABNORMAL HIGH (ref 8–27)
CO2: 17 mmol/L — ABNORMAL LOW (ref 20–29)
Calcium: 9.3 mg/dL (ref 8.7–10.3)
Chloride: 107 mmol/L — ABNORMAL HIGH (ref 96–106)
Creatinine, Ser: 2.61 mg/dL — ABNORMAL HIGH (ref 0.57–1.00)
Glucose: 93 mg/dL (ref 70–99)
Potassium: 4.3 mmol/L (ref 3.5–5.2)
Sodium: 139 mmol/L (ref 134–144)
eGFR: 20 mL/min/1.73 — ABNORMAL LOW (ref >59)

## 2024-02-07 NOTE — Progress Notes (Signed)
 bmp

## 2024-02-08 ENCOUNTER — Ambulatory Visit: Payer: Self-pay | Admitting: Cardiology

## 2024-07-06 ENCOUNTER — Telehealth: Payer: Self-pay | Admitting: Cardiology

## 2024-07-06 NOTE — Telephone Encounter (Signed)
"  ° °  Pre-operative Risk Assessment    Patient Name: Diane Steele  DOB: 1960/11/14 MRN: [id number]   Date of last office visit: 01/31/24 Date of next office visit: 07/07/24   Request for Surgical Clearance    Procedure:  colonoscopy   Date of Surgery:  Clearance TBD                                Surgeon:  Dr. Elsie Cree  Surgeon's Group or Practice Name:  [facility]  Phone number:  [phone] Fax number:  [phone]   Type of Clearance Requested:   - Medical  - Pharmacy:  Hold Apixaban  (Eliquis ) defer to cardiologist    Type of Anesthesia:  propofol    Additional requests/questions:    Bonney Larraine Salt   07/06/2024, 9:29 AM   "

## 2024-07-06 NOTE — Telephone Encounter (Signed)
" ° °  Name: Diane Steele  DOB: 02-04-1961  MRN: [id number]  Primary Cardiologist: Madonna Large, DO  Chart reviewed as part of pre-operative protocol coverage. The patient has an upcoming visit scheduled with Dr. Large on 07/07/2024 at which time clearance can be addressed in case there are any issues that would impact surgical recommendations.  Colonoscopy is not scheduled until TBD as below. I added preop FYI to appointment note so that provider is aware to address at time of outpatient visit.  Per office protocol the cardiology provider should forward their finalized clearance decision and recommendations regarding antiplatelet therapy to the requesting party below.    This message will also be routed to pharmacy pool for input on holding Eliquis  as requested below so that this information is available to the clearing provider at time of patient's appointment.   I will route this message as FYI to requesting party and remove this message from the preop box as separate preop APP input not needed at this time.   Please call with any questions.  [name], NP  07/06/2024, 12:20 PM   "

## 2024-07-06 NOTE — Telephone Encounter (Signed)
 Patient with diagnosis of Afib on Eliquis  for anticoagulation.    Procedure: Colonoscopy Date of procedure: TBD   CHA2DS2-VASc Score = 5   This indicates a 7.2% annual risk of stroke. The patient's score is based upon: CHF History: 1 HTN History: 1 Diabetes History: 1 Stroke History: 0 Vascular Disease History: 1 Age Score: 0 Gender Score: 1      CrCl 27 mL/min Platelet count 200   Patient has not had an Afib/aflutter ablation in the last 3 months, DCCV within the last 4 weeks or a watchman implanted in the last 45 days     Per office protocol, patient can hold Eliquis  for 2 days prior to procedure.   Patient will not need bridging with Lovenox (enoxaparin) around procedure.  **This guidance is not considered finalized until pre-operative APP has relayed final recommendations.**

## 2024-07-07 ENCOUNTER — Ambulatory Visit: Attending: Cardiology | Admitting: Cardiology

## 2024-07-07 ENCOUNTER — Encounter: Payer: Self-pay | Admitting: Cardiology

## 2024-07-07 VITALS — BP 95/68 | HR 68 | Resp 16 | Ht 65.0 in | Wt 198.0 lb

## 2024-07-07 DIAGNOSIS — Z955 Presence of coronary angioplasty implant and graft: Secondary | ICD-10-CM

## 2024-07-07 DIAGNOSIS — E1159 Type 2 diabetes mellitus with other circulatory complications: Secondary | ICD-10-CM

## 2024-07-07 DIAGNOSIS — I25118 Atherosclerotic heart disease of native coronary artery with other forms of angina pectoris: Secondary | ICD-10-CM

## 2024-07-07 DIAGNOSIS — E782 Mixed hyperlipidemia: Secondary | ICD-10-CM | POA: Diagnosis not present

## 2024-07-07 DIAGNOSIS — N184 Chronic kidney disease, stage 4 (severe): Secondary | ICD-10-CM

## 2024-07-07 DIAGNOSIS — Z01818 Encounter for other preprocedural examination: Secondary | ICD-10-CM | POA: Diagnosis not present

## 2024-07-07 DIAGNOSIS — Z951 Presence of aortocoronary bypass graft: Secondary | ICD-10-CM | POA: Diagnosis not present

## 2024-07-07 DIAGNOSIS — I4811 Longstanding persistent atrial fibrillation: Secondary | ICD-10-CM

## 2024-07-07 DIAGNOSIS — I10 Essential (primary) hypertension: Secondary | ICD-10-CM

## 2024-07-07 MED ORDER — ISOSORBIDE MONONITRATE ER 120 MG PO TB24: 120.0000 mg | ORAL_TABLET | Freq: Every day | ORAL | Status: AC

## 2024-07-07 MED ORDER — METOPROLOL TARTRATE 25 MG PO TABS: 12.5000 mg | ORAL_TABLET | Freq: Two times a day (BID) | ORAL | Status: AC

## 2024-07-07 MED ORDER — OLMESARTAN MEDOXOMIL 40 MG PO TABS: 40.0000 mg | ORAL_TABLET | Freq: Every day | ORAL | Status: AC

## 2024-07-07 MED ORDER — FUROSEMIDE 20 MG PO TABS: 20.0000 mg | ORAL_TABLET | Freq: Every day | ORAL | Status: AC

## 2024-07-07 MED ORDER — AMLODIPINE BESYLATE 10 MG PO TABS: 10.0000 mg | ORAL_TABLET | Freq: Every day | ORAL | Status: AC

## 2024-07-07 NOTE — Patient Instructions (Addendum)
 Medication Instructions:  Your physician recommends that you continue on your current medications as directed. Please refer to the Current Medication list given to you today.  *If you need a refill on your cardiac medications before your next appointment, please call your pharmacy*  Lab Work: None ordered If you have labs (blood work) drawn today and your tests are completely normal, you will receive your results only by: MyChart Message (if you have MyChart) OR A paper copy in the mail If you have any lab test that is abnormal or we need to change your treatment, we will call you to review the results.  Testing/Procedures: Echocardiogram  Follow-Up: At [facility], you and your health needs are our priority.  As part of our continuing mission to provide you with exceptional heart care, our providers are all part of one team.  This team includes your primary Cardiologist (physician) and Advanced Practice Providers or APPs (Physician Assistants and Nurse Practitioners) who all work together to provide you with the care you need, when you need it.  Your next appointment:   6 month(s)  Provider:   Madonna Large, DO    We recommend signing up for the patient portal called MyChart.  Sign up information is provided on this After Visit Summary.  MyChart is used to connect with patients for Virtual Visits (Telemedicine).  Patients are able to view lab/test results, encounter notes, upcoming appointments, etc.  Non-urgent messages can be sent to your provider as well.   To learn more about what you can do with MyChart, go to [email or web address].   Other Instructions  We are referring you to our Electrophysiology department for evaluation of your Atrial Fibrillation.   Your physician has requested that you have an echocardiogram. Echocardiography is a painless test that uses sound waves to create images of your heart. It provides your doctor with information about the size and shape  of your heart and how well your hearts chambers and valves are working. This procedure takes approximately one hour. There are no restrictions for this procedure. Please do NOT wear cologne, perfume, aftershave, or lotions (deodorant is allowed). Please arrive 15 minutes prior to your appointment time.  Please note: We ask at that you not bring children with you during ultrasound (echo/ vascular) testing. Due to room size and safety concerns, children are not allowed in the ultrasound rooms during exams. Our front office staff cannot provide observation of children in our lobby area while testing is being conducted. An adult accompanying a patient to their appointment will only be allowed in the ultrasound room at the discretion of the ultrasound technician under special circumstances. We apologize for any inconvenience.

## 2024-07-07 NOTE — Progress Notes (Signed)
 " Cardiology Office Note:  .    ID:  Diane Steele, DOB 08/18/1960, MRN [id number] PCP:  Claudene Prentice [name], FNP  Former Cardiology Providers: Dr. Levern, Dr. Candyce Reek [facility] Providers Cardiologist:  Madonna Large, DO , [facility] (established care 09/02/23) Electrophysiologist:  None  Click to update primary MD,subspecialty MD or APP then REFRESH:1}    Chief Complaint  Patient presents with   Coronary artery disease of native artery of native heart wi   Pre-op Exam   Follow-up    History of Present Illness: .   Diane Steele is a 64 y.o. African-American female whose past medical history and cardiovascular risk factors includes: Coronary artery disease status post surgical revascularization in 1997 at the age of 45 (LIMA to the LAD, SVG to RCA, SVG to the first diagonal, SVG to OM 2), multiple PCI's, family history of premature CAD, hypertension, heart failure, atrial fibrillation (diagnosed in 2014), Aortic atherosclerosis.  Patient established care with myself in March 2025 as she was formally under the care of Dr. Candyce Reek.  In 1997 patient underwent PTCA of the SVG to RCA.  In 2004 she underwent heart catheterization and had a 3.5 x 24 Taxus stent to SVG to the first diagonal branch and later in 2024 she underwent placement of stent to the proximal to distal circumflex.  There were 2 overlapping stents 3.75 x 33 Cypher and 3 x 38 mm stent placement as well (please see prior documentation).  In 2005 patient had severe three-vessel disease with patent stents in the circumflex, SVG to OM 2 patent, patent SVG to diagonal branch.  And SVG to the RCA.  In the SVG to the distal RCA there was TIMI II flow.  This was not suitable for any interventions per report.  In 2014 patient was diagnosed with atrial fibrillation and started on amiodarone  as well as Eliquis  for thromboembolic prophylaxis.  Patient informs me that in the past other providers are recommended rate  control strategy.  No prior history of direct-current cardioversion or atrial fibrillation ablation.  In March 2025 she was experiencing chest pain and she underwent cardiac PET/CT which notes reversible ischemia and high risk study.  At her last office visit in May 2025 she had decision was to treat her medically given her chronic kidney disease stage IV.  Her antianginal therapies include but not limited to amlodipine /Imdur /metoprolol .  She was started on Ranexa  500 mg p.o. twice daily.  And referred to Pharm.D. clinic for initiation of Repatha .  With up titration of medical therapy she is chest pain free.   Patient presents today for routine follow-up visit.  She is accompanied by her husband. He denies anginal chest pain or heart failure symptoms. Patient also wants to discuss for preoperative risk assessment prior to upcoming colonoscopy.  Colonoscopy with Dr. Burnette and date is to be determined. Patient's blood pressures are soft but clinically asymptomatic.  She does not follow any holding parameters for her blood pressure medications for GDMT.  Since starting PCSK9 inhibitors patient's LDL levels are now much better controlled at 28 mg/dL.  She has been following up with nephrology as recommended and states that she is being referred to [facility] for possible renal transplant.   Review of Systems: .   Review of Systems  Cardiovascular:  Negative for chest pain, claudication, dyspnea on exertion, irregular heartbeat, leg swelling, near-syncope, orthopnea, palpitations, paroxysmal nocturnal dyspnea and syncope.  Respiratory:  Negative for shortness of breath.  Hematologic/Lymphatic: Negative for bleeding problem.    Studies Reviewed:   EKG Interpretation Date/Time:  Friday July 07 2024 14:09:37 EST Ventricular Rate:  104 PR Interval:    QRS Duration:  72 QT Interval:  312 QTC Calculation: 410 R Axis:   56  Text Interpretation: Atrial fibrillation CONTROLLED VENTRICULAR RESPONSE  with premature ventricular or aberrantly conducted complexes Nonspecific T wave abnormality When compared with ECG of 02-Sep-2023 08:54, Nonspecific T wave abnormality no longer evident in Anterior leads Confirmed by Michele Richardson [phone]) on 07/07/2024 2:18:37 PM  Echocardiogram: 07/2021: LVEF 55 to 60%, no regional wall motion abnormalities, RV function stable, RV size mildly dilated, severely biatrial dilatation, mild to moderate mitral regurgitation, no evidence of mitral stenosis estimated RAP 3 mmHg.  Stress Testing: January 2023 Intermediate risk study.  Findings are equivocal, per report. LVEF moderately reduced-per gated SPECT 30-44%, end-diastolic cavity size is normal, no significant evidence of inducible ischemia, inferoseptal wall appears hypokinetic but no obvious scar on rest or stress images.  Cardiac PET/CT: 10/20/2023  Findings are consistent with LCX territory ischemia (with associated stress flow of 1.01). The study is high risk; though blood flow reserve may be less accurate post bypass, and LVEF reserve (-6%) may be less accurate in atrial fibrillation; the constellation of these with ischemia suggests high risk phenotype.  LV perfusion is abnormal. There is evidence of ischemia. Defect 1: There is a medium defect with moderate reduction in uptake present in the mid to basal lateral location(s) that is reversible. There is abnormal wall motion in the defect area. Consistent with ischemia; < 10% burden of myocardium.  Rest left ventricular function is abnormal. Rest global function is mildly reduced. Rest EF: 45%. Stress left ventricular function is abnormal. Stress global function is moderately reduced. Stress EF: 39%. End diastolic cavity size is normal. End systolic cavity size is normal. Imaged in atrial fibrillation with PVCs which may reduce accuracy of findings.  Coronary calcium  assessment not performed due to prior revascularization. SVG course not well visualized in  this study.   RADIOLOGY: NA  Risk Assessment/Calculations:   Click Here to Calculate/Change CHADS2VASc Score The patient's CHADS2-VASc score is 5, indicating a 7.2% annual risk of stroke.  CHF History: Yes HTN History: Yes Diabetes History: Yes Stroke History: No Vascular Disease History: Yes  Labs:       Latest Ref Rng & Units 09/28/2022    3:45 PM 10/27/2021    8:30 AM 07/19/2016   11:20 AM  CBC  WBC 4.0 - 10.5 K/uL 6.5  6.3  5.2   Hemoglobin 12.0 - 15.0 g/dL 89.9  88.8  9.5   Hematocrit 36.0 - 46.0 % 32.9  34.2  30.6   Platelets 150 - 400 K/uL 220  224  202        Latest Ref Rng & Units 02/07/2024    9:29 AM 01/31/2024    9:21 AM 11/08/2023   11:59 AM  BMP  Glucose 70 - 99 mg/dL 93  894  884   BUN 8 - 27 mg/dL 54  45  60   Creatinine 0.57 - 1.00 mg/dL 7.38  7.41  6.79   BUN/Creat Ratio 12 - 28 21  17  19    Sodium 134 - 144 mmol/L 139  141  135   Potassium 3.5 - 5.2 mmol/L 4.3  5.5  5.2   Chloride 96 - 106 mmol/L 107  111  103   CO2 20 - 29 mmol/L 17  16  18   Calcium  8.7 - 10.3 mg/dL 9.3  9.6  9.6       Latest Ref Rng & Units 02/07/2024    9:29 AM 01/31/2024    9:21 AM 11/08/2023   11:59 AM  CMP  Glucose 70 - 99 mg/dL 93  894  884   BUN 8 - 27 mg/dL 54  45  60   Creatinine 0.57 - 1.00 mg/dL 7.38  7.41  6.79   Sodium 134 - 144 mmol/L 139  141  135   Potassium 3.5 - 5.2 mmol/L 4.3  5.5  5.2   Chloride 96 - 106 mmol/L 107  111  103   CO2 20 - 29 mmol/L 17  16  18    Calcium  8.7 - 10.3 mg/dL 9.3  9.6  9.6   Total Protein 6.0 - 8.5 g/dL  6.6    Total Bilirubin 0.0 - 1.2 mg/dL  0.5    Alkaline Phos 44 - 121 IU/L  87    AST 0 - 40 IU/L  26    ALT 0 - 32 IU/L  24      Lab Results  Component Value Date   CHOL 102 01/31/2024   HDL 62 01/31/2024   LDLCALC 28 01/31/2024   TRIG 44 01/31/2024   CHOLHDL 1.6 01/31/2024   No results for input(s): LIPOA in the last 8760 hours. No components found for: NTPROBNP Recent Labs    11/08/23 1159  PROBNP 1,119*   No  results for input(s): TSH in the last 8760 hours.  External Labs: Collected: 08/05/2023 [facility] database. Total cholesterol 167, triglycerides 62, HDL 65, LDL 90, A1c 6.1.  TSH 8.3  Physical Exam:    Today's Vitals   07/07/24 1404  BP: 95/68  Pulse: 68  Resp: 16  SpO2: 97%  Weight: 198 lb (89.8 kg)  Height: 5' 5 (1.651 m)    Body mass index is 32.95 kg/m. Wt Readings from Last 3 Encounters:  07/07/24 198 lb (89.8 kg)  01/31/24 209 lb 6.4 oz (95 kg)  10/29/23 211 lb 6.4 oz (95.9 kg)    Physical Exam  Constitutional: No distress.  hemodynamically stable  Neck: No JVD present.  Cardiovascular: Normal rate, S1 normal and S2 normal. An irregularly irregular rhythm present. Exam reveals no gallop, no S3 and no S4.  No murmur heard. Pulmonary/Chest: Effort normal and breath sounds normal. No stridor. She has no wheezes. She has no rales.  Musculoskeletal:        General: No edema.     Cervical back: Neck supple.  Skin: Skin is warm.    Impression & Recommendation(s):  Impression:   ICD-10-CM   1. Coronary artery disease of native artery of native heart with stable angina pectoris  I25.118 amLODipine  (NORVASC ) 10 MG tablet    furosemide  (LASIX ) 20 MG tablet    isosorbide  mononitrate (IMDUR ) 120 MG 24 hr tablet    metoprolol  tartrate (LOPRESSOR ) 25 MG tablet    olmesartan  (BENICAR ) 40 MG tablet    2. History of coronary artery stent placement  Z95.5     3. Hx of CABG  Z95.1     4. Preoperative clearance  Z01.818 EKG 12-Lead    ECHOCARDIOGRAM COMPLETE    5. Longstanding persistent atrial fibrillation (HCC)  I48.11 Ambulatory referral to Cardiac Electrophysiology    6. Type 2 diabetes mellitus with other circulatory complication, without long-term current use of insulin  (HCC)  E11.59     7. Mixed hyperlipidemia  E78.2  8. Essential hypertension  I10     9. CKD (chronic kidney disease) stage 4, GFR 15-29 ml/min (HCC)  N18.4       Recommendation(s):  Coronary  artery disease involving native coronary artery of native heart with stable angina pectoris (HCC) Hx of CABG History of coronary artery stent placement Chronic and stable Hx of premature coronary artery disease with surgical revascularization at the age of 5 with Dr. Army. Cardiac PET/CT 09/2023: high risk study due to reversible perfusion defect.  We discussed both invasive angiography +/- uptitration of antianginal therapy.  Given her chronic kidney disease stage IV and the risk of contrast-induced nephropathy/progression of CKD patient wanted to uptitrate antianginal therapy and if refractory would be considered for invasive angiography.  With uptitration of antianginal therapy patient remains asymptomatic. Antianginal therapies include: Norvasc  10 mg p.o. daily, Imdur  120 mg p.o. daily, Lopressor  50 mg p.o. twice daily, Ranexa , and as needed nitroglycerin  Her lipids are significantly improved after initiation of Repatha .,  LDL 28 mg/dL Echo will be ordered to evaluate for structural heart disease and left ventricular systolic function. Will place holding parameters on her GDMT and antianginal therapy which will help her avoid hypotension when she starts her colonoscopy prep as well Continue amlodipine  10 mg p.o. daily, hold if SBP <120 mmHg. Continue Lasix  20 mg p.o. twice daily, hold if SBP <110 mmHg. Continue Imdur  120 mg p.o. every morning, hold if SBP <120 mmHg. Continue Lopressor  50 mg p.o. twice daily, hold if SBP <55 bpm or SBP <110 bpm Continue olmesartan  40 mg p.o. daily, hold if SBP <120 mmHg No changes in medications have been made during today's appointment with the exception of placing holding parameters to avoid hypotension  Preop cardiovascular examination: Office blood pressures today are soft but patient remains asymptomatic. EKG illustrates atrial fibrillation without ischemia. Patient has a stable coronary artery disease maximized on antianginal therapy. Will repeat  an echocardiogram to make sure that the LVEF has not significantly changed when compared to prior study.  Will provide a formal preprocedural letter upon the completion of the echo.  But in summary that shared decision was as long as there is no significant changes compared to prior studies she will be at least moderate risk from a cardiovascular standpoint based on her comorbidities of coronary artery disease status post surgical revascularization and prior cardiac PET/CT results, chronic kidney disease stage IV, diabetic, renal function with serum creatinine >2 mg/dL. Patient understands her preprocedural risk given her comorbidities. Would recommend having the procedure done at the hospital and not an outpatient surgical center.  Type 2 diabetes mellitus with other circulatory complication, without long-term current use of insulin  (HCC) Reemphasized importance of glycemic control. A1c is also improved from 6.1% February 2025 down to 5.5% as of September 2025 She follows up with Dr. Marchia  Mixed hyperlipidemia Continue rosuvastatin  40 mg p.o. daily. Continue Repatha . LDL 90 mg/dL, February 2025  LDL 28 mg/dL, August 2025  Recommend goal LDL <55 mg/dL given her CAD, CABG, and multiple PCI's.  Essential hypertension Currently the blood pressures are soft. Medication changes as discussed above  CKD (chronic kidney disease) stage 4, GFR 15-29 ml/min (HCC) Medications as discussed above. Does follow-up with nephrology, states that she is been referred to [facility] for possible transplant evaluation  Longstanding persistent atrial fibrillation (HCC) Long persistent atrial fibrillation  Rate control: Metoprolol . Rhythm control: N/A. Thromboembolic prophylaxis: Eliquis   Antiarrhythmic history: Amiodarone   Will refer to EP for consideration of atrial fibrillation ablation  candidacy and restoring sinus rhythm   Orders Placed:  Orders Placed This Encounter  Procedures   Ambulatory referral to  Cardiac Electrophysiology    Referral Priority:   Routine    Referral Type:   Consultation    Referral Reason:   Specialty Services Required    Referred to Provider:   Almetta Donnice LABOR, MD    Requested Specialty:   Cardiology    Number of Visits Requested:   1   EKG 12-Lead   ECHOCARDIOGRAM COMPLETE    Standing Status:   Future    Expected Date:   08/07/2024    Expiration Date:   07/07/2025    Where [facility] this test [facility] performed[facility]    Does the patient weigh less than or greater than 250 lbs?:   Patient weighs less than 250 lbs    Perflutren  DEFINITY  (image enhancing agent) should be administered unless hypersensitivity or allergy exist:   Administer Perflutren     Reason for exam-Echo:   CHF-Acute Diastolic  I50.31   Final Medication List:    Meds ordered this encounter  Medications   amLODipine  (NORVASC ) 10 MG tablet    Sig: Take 1 tablet (10 mg total) by mouth daily. HOLD IF SYSTOLIC BLOOD PRESSURE (TOP NUMBER) IS LESS THAN 120   furosemide  (LASIX ) 20 MG tablet    Sig: Take 1 tablet (20 mg total) by mouth daily. HOLD IF SYSTOLIC BLOOD PRESSURE (TOP NUMBER) IS LESS THAN 110   isosorbide  mononitrate (IMDUR ) 120 MG 24 hr tablet    Sig: Take 1 tablet (120 mg total) by mouth daily. HOLD IF SYSTOLIC BLOOD PRESSURE (TOP NUMBER) IS LESS THAN 120   metoprolol  tartrate (LOPRESSOR ) 25 MG tablet    Sig: Take 0.5 tablets (12.5 mg total) by mouth 2 (two) times daily. HOLD IF SYSTOLIC BLOOD PRESSURE (TOP NUMBER) IS LESS THAN 110 OR HEART RATE IS LESS THAN 55.   olmesartan  (BENICAR ) 40 MG tablet    Sig: Take 1 tablet (40 mg total) by mouth daily. HOLD IF SYSTOLIC BLOOD PRESSURE (TOP NUMBER) IS LESS THAN 120       Current Outpatient Medications:    acetaminophen  (TYLENOL ) 500 MG tablet, Take 1 tablet (500 mg total) by mouth every 6 (six) hours as needed., Disp: 30 tablet, Rfl: 0   allopurinol (ZYLOPRIM) 100 MG tablet, Take 100 mg by mouth every morning., Disp: , Rfl:     amLODipine  (NORVASC ) 10 MG tablet, Take 1 tablet (10 mg total) by mouth daily. HOLD IF SYSTOLIC BLOOD PRESSURE (TOP NUMBER) IS LESS THAN 120, Disp: , Rfl:    apixaban  (ELIQUIS ) 5 MG TABS tablet, Take 5 mg by mouth 2 (two) times daily., Disp: , Rfl:    Blood Glucose Monitoring Suppl (ONETOUCH VERIO) w/Device KIT, Use 1-4 times daily as needed/directed DX E11.9, Disp: 1 kit, Rfl: 0   Cholecalciferol (VITAMIN D3) 1.25 MG (50000 UT) CAPS, Take 1 capsule by mouth once a week., Disp: , Rfl:    Evolocumab  (REPATHA  SURECLICK) 140 MG/ML SOAJ, Inject 140 mg into the skin every 14 (fourteen) days., Disp: 6 mL, Rfl: 3   furosemide  (LASIX ) 20 MG tablet, Take 1 tablet (20 mg total) by mouth daily. HOLD IF SYSTOLIC BLOOD PRESSURE (TOP NUMBER) IS LESS THAN 110, Disp: , Rfl:    glucose blood (ONETOUCH VERIO) test strip, 1 each by Other route 2 (two) times daily. And lancets 2/day, Disp: 200 strip, Rfl: 3   isosorbide  mononitrate (IMDUR )  120 MG 24 hr tablet, Take 1 tablet (120 mg total) by mouth daily. HOLD IF SYSTOLIC BLOOD PRESSURE (TOP NUMBER) IS LESS THAN 120, Disp: , Rfl:    Lancets (ONETOUCH ULTRASOFT) lancets, Use 1-4 times daily as needed/directed  DX E11.9, Disp: 200 each, Rfl: 12   levothyroxine (SYNTHROID) 25 MCG tablet, Take 25 mcg by mouth every morning. (Patient taking differently: Take 0.5 tablets by mouth every morning.), Disp: , Rfl:    metoprolol  tartrate (LOPRESSOR ) 25 MG tablet, Take 0.5 tablets (12.5 mg total) by mouth 2 (two) times daily. HOLD IF SYSTOLIC BLOOD PRESSURE (TOP NUMBER) IS LESS THAN 110 OR HEART RATE IS LESS THAN 55., Disp: , Rfl:    MOUNJARO 5 MG/0.5ML Pen, Inject 5 mg into the skin once a week., Disp: , Rfl:    Multiple Vitamin (MULTI-VITAMIN DAILY PO), Take 1 tablet by mouth daily., Disp: , Rfl:    nitroGLYCERIN  (NITROSTAT ) 0.4 MG SL tablet, Place 0.4 mg under the tongue every 5 (five) minutes as needed for chest pain., Disp: , Rfl:    olmesartan  (BENICAR ) 40 MG tablet, Take 1  tablet (40 mg total) by mouth daily. HOLD IF SYSTOLIC BLOOD PRESSURE (TOP NUMBER) IS LESS THAN 120, Disp: , Rfl:    ranolazine  (RANEXA ) 500 MG 12 hr tablet, Take 1 tablet (500 mg total) by mouth daily., Disp: 90 tablet, Rfl: 3   rosuvastatin  (CRESTOR ) 40 MG tablet, TAKE 1 TABLET BY MOUTH EVERY DAY, Disp: 15 tablet, Rfl: 0   ULTRAM 50 MG tablet, Take 50 mg by mouth every 6 (six) hours., Disp: , Rfl:   Consent:   NA  Disposition:   6 months sooner if needed.  Her questions and concerns were addressed to her satisfaction. She voices understanding of the recommendations provided during this encounter.    Signed, Madonna Michele HAS, [facility]  A Division of [facility] [address], [place], [place] [address]   "

## 2024-07-15 ENCOUNTER — Encounter: Payer: Self-pay | Admitting: Cardiology

## 2024-07-19 ENCOUNTER — Other Ambulatory Visit (HOSPITAL_COMMUNITY): Payer: Self-pay

## 2024-07-19 ENCOUNTER — Other Ambulatory Visit: Payer: Self-pay

## 2024-08-09 ENCOUNTER — Ambulatory Visit (HOSPITAL_COMMUNITY)

## 2024-08-21 ENCOUNTER — Ambulatory Visit: Admitting: Student in an Organized Health Care Education/Training Program
